# Patient Record
Sex: Female | Born: 1970 | Race: Black or African American | Hispanic: No | Marital: Married | Smoking: Never smoker
Health system: Southern US, Community
[De-identification: ages and names within clinical notes are randomized; demographics above are authoritative.]

## PROBLEM LIST (undated history)

## (undated) DIAGNOSIS — K8020 Calculus of gallbladder without cholecystitis without obstruction: Secondary | ICD-10-CM

## (undated) DIAGNOSIS — H547 Unspecified visual loss: Secondary | ICD-10-CM

## (undated) DIAGNOSIS — R51 Headache: Secondary | ICD-10-CM

## (undated) DIAGNOSIS — J3089 Other allergic rhinitis: Secondary | ICD-10-CM

## (undated) HISTORY — DX: Unspecified visual loss: H54.7

## (undated) HISTORY — DX: Calculus of gallbladder without cholecystitis without obstruction: K80.20

## (undated) HISTORY — DX: Other allergic rhinitis: J30.89

## (undated) HISTORY — DX: Headache: R51

---

## 2016-04-11 HISTORY — PX: CHOLECYSTECTOMY: SHX55

## 2017-05-28 ENCOUNTER — Ambulatory Visit (INDEPENDENT_AMBULATORY_CARE_PROVIDER_SITE_OTHER): Payer: Self-pay | Admitting: Internal Medicine

## 2017-05-28 ENCOUNTER — Encounter: Payer: Self-pay | Admitting: Internal Medicine

## 2017-05-28 VITALS — BP 112/82 | HR 74 | Resp 12 | Ht 62.0 in | Wt 171.0 lb

## 2017-05-28 DIAGNOSIS — R1084 Generalized abdominal pain: Secondary | ICD-10-CM

## 2017-05-28 DIAGNOSIS — G8929 Other chronic pain: Secondary | ICD-10-CM

## 2017-05-28 DIAGNOSIS — R51 Headache: Secondary | ICD-10-CM

## 2017-05-28 DIAGNOSIS — R519 Headache, unspecified: Secondary | ICD-10-CM

## 2017-05-28 DIAGNOSIS — J3089 Other allergic rhinitis: Secondary | ICD-10-CM

## 2017-05-28 DIAGNOSIS — M542 Cervicalgia: Secondary | ICD-10-CM

## 2017-05-28 DIAGNOSIS — H547 Unspecified visual loss: Secondary | ICD-10-CM

## 2017-05-28 HISTORY — DX: Headache, unspecified: R51.9

## 2017-05-28 HISTORY — DX: Other chronic pain: G89.29

## 2017-05-28 HISTORY — DX: Unspecified visual loss: H54.7

## 2017-05-28 HISTORY — DX: Other allergic rhinitis: J30.89

## 2017-05-28 MED ORDER — FAMOTIDINE 20 MG PO TABS: ORAL_TABLET | ORAL | 3 refills | Status: DC

## 2017-05-28 MED ORDER — CYCLOBENZAPRINE HCL 5 MG PO TABS: ORAL_TABLET | ORAL | 1 refills | Status: DC

## 2017-05-28 MED ORDER — CETIRIZINE HCL 10 MG PO TABS: 10.0000 mg | ORAL_TABLET | Freq: Every day | ORAL | 11 refills | Status: DC

## 2017-05-28 NOTE — Progress Notes (Signed)
LCSWA met with Secily to complete the new patient screener. Brianda explained that because she has a lot of pain she has very low energy and has difficulties concentrating on her daily activities. She is also dealing with being unemployed and having two teenaged children who she often worries about. Nneoma reported that being unemployed makes it difficult to afford medications. LCSWA informed B[place]ley about counseling services. No follow up needed.
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
## 2017-05-29 LAB — CBC WITH DIFFERENTIAL/PLATELET
BASOS: 1 %
Basophils Absolute: 0 10*3/uL (ref 0.0–0.2)
EOS (ABSOLUTE): 0.1 10*3/uL (ref 0.0–0.4)
EOS: 2 %
HEMATOCRIT: 36.3 % (ref 34.0–46.6)
Hemoglobin: 11.7 g/dL (ref 11.1–15.9)
IMMATURE GRANS (ABS): 0 10*3/uL (ref 0.0–0.1)
IMMATURE GRANULOCYTES: 0 %
Lymphocytes Absolute: 2.6 10*3/uL (ref 0.7–3.1)
Lymphs: 53 %
MCH: 28.6 pg (ref 26.6–33.0)
MCHC: 32.2 g/dL (ref 31.5–35.7)
MCV: 89 fL (ref 79–97)
MONOS ABS: 0.3 10*3/uL (ref 0.1–0.9)
Monocytes: 6 %
NEUTROS ABS: 1.9 10*3/uL (ref 1.4–7.0)
Neutrophils: 38 %
Platelets: 244 10*3/uL (ref 150–379)
RBC: 4.09 x10E6/uL (ref 3.77–5.28)
RDW: 14.2 % (ref 12.3–15.4)
WBC: 4.9 10*3/uL (ref 3.4–10.8)

## 2017-05-29 LAB — COMPREHENSIVE METABOLIC PANEL
ALT: 12 IU/L (ref 0–32)
AST: 17 IU/L (ref 0–40)
Albumin/Globulin Ratio: 1.3 (ref 1.2–2.2)
Albumin: 3.9 g/dL (ref 3.5–5.5)
Alkaline Phosphatase: 48 IU/L (ref 39–117)
BILIRUBIN TOTAL: 0.4 mg/dL (ref 0.0–1.2)
BUN/Creatinine Ratio: 20 (ref 9–23)
BUN: 12 mg/dL (ref 6–24)
CHLORIDE: 104 mmol/L (ref 96–106)
CO2: 23 mmol/L (ref 20–29)
Calcium: 9 mg/dL (ref 8.7–10.2)
Creatinine, Ser: 0.61 mg/dL (ref 0.57–1.00)
GFR, EST AFRICAN AMERICAN: 124 mL/min/{1.73_m2} (ref >59)
GFR, EST NON AFRICAN AMERICAN: 108 mL/min/{1.73_m2} (ref >59)
GLOBULIN, TOTAL: 3.1 g/dL (ref 1.5–4.5)
Glucose: 83 mg/dL (ref 65–99)
Potassium: 3.9 mmol/L (ref 3.5–5.2)
SODIUM: 142 mmol/L (ref 134–144)
TOTAL PROTEIN: 7 g/dL (ref 6.0–8.5)

## 2017-08-25 ENCOUNTER — Ambulatory Visit: Payer: Self-pay | Admitting: Internal Medicine

## 2017-08-27 ENCOUNTER — Ambulatory Visit: Payer: Self-pay | Admitting: Internal Medicine

## 2017-09-17 ENCOUNTER — Encounter: Payer: Self-pay | Admitting: Internal Medicine

## 2017-09-17 ENCOUNTER — Ambulatory Visit: Payer: Self-pay | Admitting: Internal Medicine

## 2017-09-17 VITALS — BP 122/80 | HR 76 | Resp 12 | Ht 62.0 in | Wt 163.0 lb

## 2017-09-17 DIAGNOSIS — H547 Unspecified visual loss: Secondary | ICD-10-CM

## 2017-09-17 DIAGNOSIS — K2970 Gastritis, unspecified, without bleeding: Secondary | ICD-10-CM

## 2017-09-17 DIAGNOSIS — M7918 Myalgia, other site: Secondary | ICD-10-CM

## 2017-09-17 DIAGNOSIS — R51 Headache: Secondary | ICD-10-CM

## 2017-09-17 DIAGNOSIS — R519 Headache, unspecified: Secondary | ICD-10-CM

## 2017-09-17 DIAGNOSIS — J3089 Other allergic rhinitis: Secondary | ICD-10-CM

## 2017-09-17 DIAGNOSIS — Z23 Encounter for immunization: Secondary | ICD-10-CM

## 2017-09-17 DIAGNOSIS — G8929 Other chronic pain: Secondary | ICD-10-CM

## 2017-09-17 MED ORDER — FEXOFENADINE HCL 180 MG PO TABS: 180.0000 mg | ORAL_TABLET | Freq: Every day | ORAL | Status: DC

## 2017-09-17 MED ORDER — IBUPROFEN 200 MG PO TABS: ORAL_TABLET | ORAL | Status: DC

## 2017-09-17 NOTE — Progress Notes (Signed)
   Subjective:    Patient ID: Alison Stanton, female    DOB: 02/17/1971, 46 y.o.   MRN: [id number]  HPI   Alison Stanton here to interpret  1.  Headaches:  Did see Optometry end of August, but were not sure what to do about getting glasses.   [place] to [place].  Alison Stanton states he received a phone call from the PT clinic, but he understood that they were to call him back and never heard anything more. Discussed to call [facility] at [facility] in future if not hearing about follow up or if have questions. Cyclobenzaprine helped with sleep, but did not help with headache.  Has not taken Cyclobenzaprine for a week.    2.  Abdominal pain: Was using Famotidine only when she had stomach pain.  This has worked well for her.  Only filled it once since first visit in July.  Is still using ibuprofen despite recommendations to stop.    3.  Allergies:  Not taking Cetirizine.  Stopped as made her sleepy, even when she took at bedtime.  Did help at times with allergy symptoms.  Not clear that it helped with headaches.  Is having some allergy symptoms.    4.  Slipped cleaning the shower 5 days ago.  Struck her right nuchal area on the sink and then landed on her right side.   She is hurting along her entire right side.  Pain is improving day by day, but still with discomfort. Ibuprofen helps.    Current Meds  Medication Sig  . ibuprofen (ADVIL,MOTRIN) 200 MG tablet Take 200 mg every 6 (six) hours as needed by mouth.    No Known Allergies    Review of Systems     Objective:   Physical Exam  NAD HEENT: PERRL, EOMI, discs sharp TMs pearly gray, throat without injection.  No palpable swelling over nuchal area bilaterally.  No discoloration. Neck:  Supple, no adenopathy.  Tender still over traps, medial to scapulae, and up cervical paraspinous musulature to nuchal ridge, left worse than right. Chest:  CTA CV:  RRR without murmur or rub, radial pulses normal and equal. Abd:  S, NT, No HSM or mass,  + BS MS:  Moves all extrems fine.  No bruising or swelling.  Mild tenderness over right greater trochanteric area where she has some pain.  No crepitation.      Assessment & Plan:  1.  Decreased Visual Acuity:  [facility].  2.  Headaches/multifactorial:  As above:  S[facility] back [facility].  3.  Allergies:  To pick up Fexofenadine 180 mg daily and take instead of Cetirizine as less sedating for most.  4.  Gastritis/PUD:  To keep taking Famotidine 40 mg daily, not just on days she is having symptoms.  Would like her to take consistently for 2 months at least.  5.  Fall with what appears to be minor soft tissue discomforts:  Tylenol or low dose Ibuprofen for pain as needed.  6.  HM: influenza vaccine today.

## 2017-12-15 ENCOUNTER — Ambulatory Visit: Payer: Self-pay | Admitting: Internal Medicine

## 2017-12-16 ENCOUNTER — Encounter: Payer: Self-pay | Admitting: Internal Medicine

## 2017-12-16 ENCOUNTER — Ambulatory Visit: Payer: Self-pay | Admitting: Internal Medicine

## 2017-12-16 VITALS — BP 122/80 | HR 70 | Resp 12 | Ht 62.0 in | Wt 171.0 lb

## 2017-12-16 DIAGNOSIS — H547 Unspecified visual loss: Secondary | ICD-10-CM

## 2017-12-16 DIAGNOSIS — R51 Headache: Secondary | ICD-10-CM

## 2017-12-16 DIAGNOSIS — G8929 Other chronic pain: Secondary | ICD-10-CM

## 2017-12-16 DIAGNOSIS — M7918 Myalgia, other site: Secondary | ICD-10-CM

## 2017-12-16 DIAGNOSIS — M7061 Trochanteric bursitis, right hip: Secondary | ICD-10-CM

## 2017-12-16 DIAGNOSIS — J3089 Other allergic rhinitis: Secondary | ICD-10-CM

## 2017-12-16 DIAGNOSIS — M25511 Pain in right shoulder: Secondary | ICD-10-CM

## 2017-12-16 DIAGNOSIS — R519 Headache, unspecified: Secondary | ICD-10-CM

## 2017-12-16 MED ORDER — MOMETASONE FUROATE 50 MCG/ACT NA SUSP: NASAL | 12 refills | Status: DC

## 2017-12-16 MED ORDER — FAMOTIDINE 20 MG PO TABS: ORAL_TABLET | ORAL | 3 refills | Status: DC

## 2017-12-16 NOTE — Progress Notes (Signed)
   Subjective:    Patient ID: Alison Stanton, female    DOB: [birth year], 47 y.o.   MRN: [id number]  HPI  1.  Headaches:  Multifactorial:  Still did not make it to [facility].  States she just found out from her English speaking roommate that the PT clinic was trying to contact her for an appointment and had left messages in English on the phone.  This was our second attempt to get her in. Her friend Nas, who accompanies her today is willing to take phone calls for her to get an appointment. Her headaches are better with her glasses.  She is still having muscular tension headache in neck and back of head and would like another attempt to get treatment there again.   Since she fell in the shower with last visit, she continues to have right sided shoulder and lateral thigh pain.  She would like this addressed with PT as well.   2.  Decreased visual acuity:  Did get the glasses and now her headache pain is much less and her vision is much better with the glasses.   3.  Allergies:  She did start the Allegra 180 mg daily.  She states it works well for her except her symptoms return when she stops taking the medication.  Discussed this is a treated health issue, not a cured one and symptoms will return if she stops the medication. No pets Does have carpeting.  Vacuums every 2 weeks. Lots of pillows on bed.   No hypoallergenic mattress and pillow covers.  4.  Gastritis/PUD:  Taking Famotidine 40 mg daily.  States her abdominal discomfort is much better.  She has been taking for about 3 months now.    Current Meds  Medication Sig  . fexofenadine (ALLEGRA) 180 MG tablet Take 1 tablet (180 mg total) daily by mouth.  Marland Kitchen. ibuprofen (ADVIL,MOTRIN) 200 MG tablet 2-4 tabs by mouth every 6 hours as needed for pain    No Known Allergies Review of Systems     Objective:   Physical Exam   NAD HEENT:  Conjunctivae without injection.  Nasal mucosa swollen and boggy, cobbled posterior  pharynx.  TMs pearly gray. Neck:  Supple, No adenopathy.  Tender over traps bilaterally and paracervical musculature to nuchal ridge. Right shoulder:  Tender over subacromial bursa with internal and external rotation. Right thigh:  Tender over right greater trochanter Chest:  CTA CV:  RRR wihout murmur or rub. Radial pulses normal and equal. Abd:  S, NT, No HSM or mass, + BS         Assessment & Plan:  1.  Headaches:  Again, multifactorial.  Improved with new glasses and treatment of allergies.  Musculoskeletal attention for neck issues with another attempt with PT.  2.  Allergies:  Add Nasonex nasal spray.  Hypoallergenic pillow and mattress covers.  Continue Allegra.  3.  Right shoulder and right greater trochanteric bursitis:  PT as well.  4.  Gastritis/PUD:  Has completed course of Famotidine.  Okay to stop unless symptoms recur.    Add Nasonex

## 2017-12-16 NOTE — Patient Instructions (Signed)
Hypoallergenic pillow and mattress covers--wipe down weekly when you wash your bed covers. Vacuum 1-2 times weekly after dusting.

## 2018-01-18 ENCOUNTER — Encounter: Payer: Self-pay | Admitting: Internal Medicine

## 2018-03-16 ENCOUNTER — Encounter: Payer: Self-pay | Admitting: Internal Medicine

## 2018-07-05 ENCOUNTER — Other Ambulatory Visit: Payer: Self-pay

## 2018-07-05 ENCOUNTER — Encounter (HOSPITAL_COMMUNITY): Payer: Self-pay | Admitting: Emergency Medicine

## 2018-07-05 ENCOUNTER — Emergency Department (HOSPITAL_COMMUNITY)
Admission: EM | Admit: 2018-07-05 | Discharge: 2018-07-05 | Disposition: A | Discharge status: Home / Self Care | Payer: Self-pay | Attending: Emergency Medicine | Admitting: Emergency Medicine

## 2018-07-05 DIAGNOSIS — R109 Unspecified abdominal pain: Secondary | ICD-10-CM | POA: Insufficient documentation

## 2018-07-05 LAB — COMPREHENSIVE METABOLIC PANEL
ALBUMIN: 3.5 g/dL (ref 3.5–5.0)
ALT: 14 U/L (ref 0–44)
ANION GAP: 4 — AB (ref 5–15)
AST: 21 U/L (ref 15–41)
Alkaline Phosphatase: 42 U/L (ref 38–126)
BILIRUBIN TOTAL: 0.7 mg/dL (ref 0.3–1.2)
BUN: 12 mg/dL (ref 6–20)
CHLORIDE: 108 mmol/L (ref 98–111)
CO2: 25 mmol/L (ref 22–32)
Calcium: 8.6 mg/dL — ABNORMAL LOW (ref 8.9–10.3)
Creatinine, Ser: 0.8 mg/dL (ref 0.44–1.00)
GFR calc Af Amer: >60 mL/min (ref >60)
GLUCOSE: 98 mg/dL (ref 70–99)
Potassium: 3.7 mmol/L (ref 3.5–5.1)
Sodium: 137 mmol/L (ref 135–145)
Total Protein: 7 g/dL (ref 6.5–8.1)

## 2018-07-05 LAB — CBC
HCT: 39.6 % (ref 36.0–46.0)
HEMOGLOBIN: 12.3 g/dL (ref 12.0–15.0)
MCH: 28.6 pg (ref 26.0–34.0)
MCHC: 31.1 g/dL (ref 30.0–36.0)
MCV: 92.1 fL (ref 78.0–100.0)
Platelets: 241 10*3/uL (ref 150–400)
RBC: 4.3 MIL/uL (ref 3.87–5.11)
RDW: 13 % (ref 11.5–15.5)
WBC: 5.7 10*3/uL (ref 4.0–10.5)

## 2018-07-05 LAB — URINALYSIS, ROUTINE W REFLEX MICROSCOPIC
Bilirubin Urine: NEGATIVE
Glucose, UA: NEGATIVE mg/dL
Hgb urine dipstick: NEGATIVE
Ketones, ur: NEGATIVE mg/dL
LEUKOCYTES UA: NEGATIVE
NITRITE: NEGATIVE
PH: 6 (ref 5.0–8.0)
Protein, ur: NEGATIVE mg/dL
SPECIFIC GRAVITY, URINE: 1.015 (ref 1.005–1.030)

## 2018-07-05 LAB — LIPASE, BLOOD: LIPASE: 42 U/L (ref 11–51)

## 2018-07-05 LAB — I-STAT BETA HCG BLOOD, ED (MC, WL, AP ONLY): I-stat hCG, quantitative: <5 m[IU]/mL (ref <5)

## 2018-07-05 MED ORDER — SUCRALFATE 1 G PO TABS
1.0000 g | ORAL_TABLET | Freq: Once | ORAL | Status: AC
  Administered 2018-07-05: 1 g via ORAL
  Filled 2018-07-05: qty 1

## 2018-07-05 MED ORDER — FAMOTIDINE IN NACL 20-0.9 MG/50ML-% IV SOLN
20.0000 mg | Freq: Once | INTRAVENOUS | Status: AC
  Administered 2018-07-05: 20 mg via INTRAVENOUS
  Filled 2018-07-05: qty 50

## 2018-07-05 MED ORDER — SODIUM CHLORIDE 0.9 % IV BOLUS
500.0000 mL | Freq: Once | INTRAVENOUS | Status: AC
  Administered 2018-07-05: 500 mL via INTRAVENOUS

## 2018-07-05 MED ORDER — GI COCKTAIL ~~LOC~~
30.0000 mL | Freq: Once | ORAL | Status: AC
  Administered 2018-07-05: 30 mL via ORAL
  Filled 2018-07-05: qty 30

## 2018-07-05 MED ORDER — FAMOTIDINE 20 MG PO TABS: 20.0000 mg | ORAL_TABLET | Freq: Two times a day (BID) | ORAL | 0 refills | Status: DC

## 2018-07-05 MED ORDER — SUCRALFATE 1 G PO TABS: 1.0000 g | ORAL_TABLET | Freq: Three times a day (TID) | ORAL | 0 refills | Status: DC

## 2018-07-05 NOTE — ED Notes (Signed)
Pt given d/c instructions via translation through pt's husband. Pt and husband verbalized understanding and verified understanding of prescription drop-off options. Pt had no other concerns prior to ambulating to lobby.

## 2018-07-05 NOTE — ED Provider Notes (Signed)
[facility] EMERGENCY DEPARTMENT Provider Note   CSN: [id number] Arrival date & time: 07/05/18  1731     History   Chief Complaint Chief Complaint  Patient presents with  . Abdominal Pain  . Generalized Body Aches   Patient originally from [place].  She speaks [place].  She was offered translator multiple times refused.  Her family member is at bedside and assist with a history and translation.  HPI Alison Stanton is a 47 y.o. female.  HPI   Patient is a 47 year old female with a history of chronic headaches, decreased visual acuity, seasonal allergies, gastritis/PUD, cholecystectomy, who presents the emergency department today planing of epigastric abdominal pain that has been present for the last several years but seem to have worsened this morning.  Patient has had pain intermittently.  Seems to be worse after spicy foods.  Pain does not radiate.  She rates it a 9/10.  Describes it as a burning feeling.  Is associated with nausea and vomiting.  Had 2 episodes of vomiting yesterday that has since resolved.  She also had one episode of diarrhea this morning.  Has intermittent constipation, but none currently.  States she had one episode of dark red blood per rectum last week and has had none since. Denies any urinary symptoms.  Is complaining of body aches, chills, and subjective fevers.  She has not taken her temperature at home. Took Motrin around noon today.  No chest pain or shortness of breath.  Reviewed prior records.  Patient was seen by her PCP 12/2017.  At that time had been prescribed famotidine 40 mg daily for PUD/gastritis and her symptoms had been improving.  She states that she is no longer taking famotidine, but it seemed to help her symptoms when she was taking it.  States that her symptoms today are consistent with symptoms she was seen for at this PCP visit.  Denies any recent trips out of the country or eating any spoiled foods recently.  Past Medical  History:  Diagnosis Date  . Chronic headaches 05/28/2017  . 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 Environmental and seasonal allergies 05/28/2017    Patient Active Problem List   Diagnosis Date Noted  . 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 Chronic headaches 05/28/2017  . Environmental and seasonal allergies 05/28/2017    Past Surgical History:  Procedure Laterality Date  . CHOLECYSTECTOMY  04/2016   [place]  [place]     OB History   None      Home Medications    Prior to Admission medications   Medication Sig Start Date End Date Taking? Authorizing Provider  famotidine (PEPCID) 20 MG tablet Take 1 tablet (20 mg total) by mouth 2 (two) times daily for 14 days. 07/05/18 07/19/18  Khameron Gruenwald S, PA-C  fexofenadine (ALLEGRA) 180 MG tablet Take 1 tablet (180 mg total) daily by mouth. Patient not taking: Reported on 07/05/2018 09/17/17   Julieanne Manson, MD  mometasone (NASONEX) 50 MCG/ACT nasal spray 2 sprays each nostril daily Patient not taking: Reported on 07/05/2018 12/16/17   Julieanne Manson, MD  sucralfate (CARAFATE) 1 g tablet Take 1 tablet (1 g total) by mouth 3 (three) times daily with meals for 14 days. 07/05/18 07/19/18  Adyn Hoes S, PA-C    Family History Family History  Problem Relation Age of Onset  . Hypertension Mother   . Headache Father   . Hypertension Father     Social History Social History   Tobacco  Use  . Smoking status: Never Smoker  . Smokeless tobacco: Never Used  Substance Use Topics  . Alcohol use: No  . Drug use: No     Allergies   Patient has no known allergies.   Review of Systems Review of Systems  Constitutional: Negative for chills and fever.  HENT: Negative for ear pain and sore throat.   Eyes: Negative for pain and visual disturbance.  Respiratory: Negative for cough and shortness of breath.   Cardiovascular: Negative for chest pain.  Gastrointestinal: Positive for abdominal pain, blood in  stool (resolved), nausea and vomiting. Negative for constipation and diarrhea.  Genitourinary: Negative for dysuria and hematuria.  Musculoskeletal: Positive for myalgias.  Skin: Negative for rash.  Neurological: Negative for dizziness, weakness, light-headedness, numbness and headaches.  All other systems reviewed and are negative.  Physical Exam Updated Vital Signs BP 101/73   Pulse 66   Temp 98.3 F (36.8 C) (Oral)   Resp 20   Ht 5' (1.524 m)   LMP 06/28/2018 (Exact Date)   SpO2 100%   BMI 33.40 kg/m   Physical Exam  Constitutional: She appears well-developed and well-nourished.  Non-toxic appearance. No distress.  HENT:  Head: Normocephalic and atraumatic.  Eyes: Conjunctivae are normal. No scleral icterus.  Neck: Neck supple.  Cardiovascular: Normal rate, regular rhythm, normal heart sounds and intact distal pulses.  No murmur heard. Pulmonary/Chest: Effort normal and breath sounds normal. No stridor. No respiratory distress. She has no wheezes. She has no rales.  Abdominal: Soft. There is no CVA tenderness.  Mild epigastric and RUQ abd TTP. No rebound tenderness. No guarding. No rigidity.   Musculoskeletal: She exhibits no edema.  Neurological: She is alert.  Skin: Skin is warm and dry. Capillary refill takes less than 2 seconds.  Psychiatric: She has a normal mood and affect.  Nursing note and vitals reviewed.  ED Treatments / Results  Labs (all labs ordered are listed, but only abnormal results are displayed) Labs Reviewed  COMPREHENSIVE METABOLIC PANEL - Abnormal; Notable for the following components:      Result Value   Calcium 8.6 (*)    Anion gap 4 (*)    All other components within normal limits  LIPASE, BLOOD  CBC  URINALYSIS, ROUTINE W REFLEX MICROSCOPIC  I-STAT BETA HCG BLOOD, ED ([facility], [facility], [facility] ONLY)    EKG None  Radiology No results found.  Procedures Procedures (including critical care time)  Medications Ordered in ED Medications    sucralfate (CARAFATE) tablet 1 g (1 g Oral Given 07/05/18 1953)  famotidine (PEPCID) IVPB 20 mg premix (0 mg Intravenous Stopped 07/05/18 2124)  sodium chloride 0.9 % bolus 500 mL (0 mLs Intravenous Stopped 07/05/18 2124)  gi cocktail (Maalox,Lidocaine,Donnatal) (30 mLs Oral Given 07/05/18 1954)     Initial Impression / Assessment and Plan / ED Course  I have reviewed the triage vital signs and the nursing notes.  Pertinent labs & imaging results that were available during my care of the patient were reviewed by me and considered in my medical decision making (see chart for details).     Final Clinical Impressions(s) / ED Diagnoses   Final diagnoses:  Abdominal pain, unspecified abdominal location   Patient is nontoxic, nonseptic appearing, in no apparent distress.  Patient's pain and other symptoms adequately managed in emergency department.  Fluid bolus given.  Labs, imaging and vitals reviewed.  Leukocytosis or anemia.  Normal electrolytes.  Normal kidney and liver function.  Lipase negative.  Negative pregnancy test.  UA negative for UTI.  Patient does not meet the SIRS or Sepsis criteria.  On repeat exam patient's temp times feel completely resolved after GI cocktail, Pepcid and Carafate.  No episodes of vomiting in the ED.  Was able to tolerate p.o.  Does not have a surgical abdomin and there are no peritoneal signs.  No indication of appendicitis, bowel obstruction, bowel perforation, cholecystitis, diverticulitis, PID or ectopic pregnancy.  Suspect symptoms are due to her chronic gastritis/PUD.  Patient discharged home with symptomatic treatment and given strict instructions for follow-up with their primary care physician.  I have also discussed reasons to return immediately to the ER.  Patient expresses understanding and agrees with plan.  ED Discharge Orders         Ordered    famotidine (PEPCID) 20 MG tablet  2 times daily     07/05/18 2223    sucralfate (CARAFATE) 1 g tablet  3  times daily with meals     07/05/18 2223           Karrie Meres, PA-C 07/05/18 2225    Lorre Nick, MD 07/07/18 [phone]

## 2018-07-05 NOTE — Discharge Instructions (Addendum)

## 2018-07-05 NOTE — ED Triage Notes (Signed)
Pt to ED with c/o generalized abd pain and body aches x's 3 days.  No nausea or vomiting

## 2018-09-22 ENCOUNTER — Ambulatory Visit: Payer: Self-pay | Attending: Family Medicine | Admitting: Family Medicine

## 2018-09-22 ENCOUNTER — Encounter: Payer: Self-pay | Admitting: Family Medicine

## 2018-09-22 VITALS — BP 118/79 | HR 76 | Temp 98.2°F | Resp 18 | Ht 64.0 in | Wt 182.0 lb

## 2018-09-22 DIAGNOSIS — K219 Gastro-esophageal reflux disease without esophagitis: Secondary | ICD-10-CM

## 2018-09-22 DIAGNOSIS — G8929 Other chronic pain: Secondary | ICD-10-CM

## 2018-09-22 DIAGNOSIS — G43109 Migraine with aura, not intractable, without status migrainosus: Secondary | ICD-10-CM

## 2018-09-22 DIAGNOSIS — Z8249 Family history of ischemic heart disease and other diseases of the circulatory system: Secondary | ICD-10-CM | POA: Insufficient documentation

## 2018-09-22 DIAGNOSIS — R002 Palpitations: Secondary | ICD-10-CM

## 2018-09-22 DIAGNOSIS — M25511 Pain in right shoulder: Secondary | ICD-10-CM

## 2018-09-22 MED ORDER — RIZATRIPTAN BENZOATE 10 MG PO TABS: 10.0000 mg | ORAL_TABLET | ORAL | 1 refills | Status: DC | PRN

## 2018-09-22 MED ORDER — OMEPRAZOLE 40 MG PO CPDR: 40.0000 mg | DELAYED_RELEASE_CAPSULE | Freq: Every day | ORAL | 3 refills | Status: DC

## 2018-09-22 MED ORDER — TOPIRAMATE 25 MG PO TABS: 25.0000 mg | ORAL_TABLET | Freq: Every day | ORAL | 1 refills | Status: DC

## 2018-09-22 MED ORDER — DICLOFENAC SODIUM 1 % TD GEL: TRANSDERMAL | 6 refills | Status: DC

## 2018-09-22 NOTE — Progress Notes (Signed)
Subjective:    Patient ID: Alison Stanton, female    DOB: 04-05-1971, 47 y.o.   MRN: [id number]  HPI       47 year old female who is originally from [place] who presents with complaint of chronic pain in the right shoulder and down her right arm for about 5 months as well as recurrent headaches for over the past year.  Patient states that she has had pain in her right shoulder for about 5 months.  Patient does not recall any initial injury to her shoulder.  Shoulder pain is worse if she lies on her right side or tries to lift objects with her right hand or if she tries to lift her right hand above her head.  Patient is right-handed.  Pain occurs off and on and is about a 7 or 8 on a 0-10 scale and is a dull, aching sensation but sharp with certain movements.        Patient reports that the headaches are in the top of her head and start as a crawling sensation and then become throbbing.  Headaches are an 8-9 on a 0-to-10 scale.  Headaches are almost daily.  Patient has tried ibuprofen and Tylenol in the past to help with her headaches without relief.  Patient has now started to have stomach upset/acid reflux symptoms with the use of ibuprofen and therefore she is no longer taking this medication.  Patient also has blurred vision which can occur with or without the headache.  Patient sometimes sees wavy lines in her vision prior to onset of headache.  Patient does have nausea as well as sensitivity to light and noise during a headache.  Patient at times has palpitations and shortness of breath which occur with her headaches.  Patient also feels as if she is recently started having cold symptoms over the last 5 days with some nasal congestion, postnasal drainage and nonproductive cough.  Patient denies fever or chills.      Patient denies any past allergies.  Patient reports past medical history is significant only for her recurrent headaches.  Patient reports family history of mother and father having  hypertension.  Patient reports that she is divorced.  Patient does not drink or smoke cigarettes.   Review of Systems  Constitutional: Positive for fatigue. Negative for chills and fever.  HENT: Positive for congestion, postnasal drip and rhinorrhea. Negative for ear pain, sinus pressure, sinus pain, sneezing, sore throat and trouble swallowing.   Eyes: Positive for photophobia and visual disturbance.       Symptoms occur with headaches  Respiratory: Positive for cough and shortness of breath (with headache).   Cardiovascular: Positive for palpitations (occurs with her headaches). Negative for chest pain and leg swelling.  Gastrointestinal: Positive for nausea (sometimes occurs with headaches). Negative for abdominal pain.  Endocrine: Negative for polydipsia, polyphagia and polyuria.  Genitourinary: Negative for dysuria and frequency.  Musculoskeletal: Positive for arthralgias and myalgias. Negative for back pain, gait problem and joint swelling.  Neurological: Positive for headaches. Negative for facial asymmetry and light-headedness.       Objective:   Physical Exam BP 118/79 (BP Location: Left Arm, Patient Position: Sitting, Cuff Size: Normal)   Pulse 76   Temp 98.2 F (36.8 C) (Oral)   Resp 18   Ht 5\' 4"  (1.626 m)   Wt 182 lb (82.6 kg)   LMP 08/25/2018   SpO2 100%   BMI 31.24 kg/m Nurse's notes and vital signs reviewed General-well-nourished, well-developed  female in no acute distress. HEENT-head is a traumatic normocephalic, conjunctiva normal, extraocular movements intact, pupils equally round and reactive to light, TMs dull, nares with moderate edema of the nasal turbinates with mild edema/erythema and clear nasal discharge, patient with posterior pharynx erythema with mild cobblestoning Neck-supple, no lymphadenopathy, no thyromegaly, no carotid bruit Lungs-clear to auscultation bilaterally Cardiovascular-regular rate and rhythm Abdomen-soft, nontender Back-no CVA  tenderness Musculoskeletal- patient with some tenderness to palpation of the lateral anterior and posterior right shoulder.  Positive empty can sign/impingement sign at the right shoulder.  No arm weakness.  Patient with discomfort with overhead arm movement on the right. Extremities-no edema Neuro-cranial nerves II through XII are grossly intact        Assessment & Plan:  1. Migraine with aura and without status migrainosus, not intractable Will have patient start Topamax 25mg  once daily at bedtime as a preventative and if she tolerates this ok then will increase dose and times per day to help lessen the number and severity of her headaches. Patient will be be given RX for Maxalt to take as needed for her headache pain - topiramate (TOPAMAX) 25 MG tablet; Take 1 tablet (25 mg total) by mouth at bedtime. To help prevent headaches  Dispense: 30 tablet; Refill: 1 - rizatriptan (MAXALT) 10 MG tablet; Take 1 tablet (10 mg total) by mouth as needed for migraine. May repeat in 2 hours if needed; max 4 pills/24 hours  Dispense: 10 tablet; Refill: 1  2. Gastroesophageal reflux disease, esophagitis presence not specified Will place patient on Omeprazole 40 mg once per day and have her avoid known trigger foods as well as spicy, greasy foods and avoidance of late night eating - omeprazole (PRILOSEC) 40 MG capsule; Take 1 capsule (40 mg total) by mouth daily. To reduce stomach acid  Dispense: 30 capsule; Refill: 3  3. Palpitations Complaint of palpitations at times during headaches- will check TSH, BMP and CBC to look for thyroid disorder, electrolyte abnormality or anemia in follow-up and if labs are normal and symptoms persist then may need EKG and holtor monitoring to determine the cause of her palpitations - TSH - Basic Metabolic Panel - CBC with Differential  4. Chronic right shoulder pain Patient with chronic right shoulder pain, possibly with rotator cuff tendonopathy and Rx provided for her to  try voltaren gel for pain so that she will not have increased GERD symptoms from additional NSAID's at this time - diclofenac sodium (VOLTAREN) 1 % GEL; Apply up to 4 g, 4 times daily as needed for shoulder pain  Dispense: 2 Tube; Refill: 6  An After Visit Summary was printed and given to the patient.  Return in about 6 weeks (around 11/03/2018).

## 2018-09-23 LAB — CBC WITH DIFFERENTIAL/PLATELET
Basophils Absolute: 0.1 x10E3/uL (ref 0.0–0.2)
Basos: 1 %
EOS (ABSOLUTE): 0.1 x10E3/uL (ref 0.0–0.4)
Eos: 2 %
Hematocrit: 35.5 % (ref 34.0–46.6)
Hemoglobin: 11.7 g/dL (ref 11.1–15.9)
Immature Grans (Abs): 0 x10E3/uL (ref 0.0–0.1)
Immature Granulocytes: 0 %
Lymphocytes Absolute: 2.1 x10E3/uL (ref 0.7–3.1)
Lymphs: 47 %
MCH: 28.7 pg (ref 26.6–33.0)
MCHC: 33 g/dL (ref 31.5–35.7)
MCV: 87 fL (ref 79–97)
Monocytes Absolute: 0.3 x10E3/uL (ref 0.1–0.9)
Monocytes: 7 %
Neutrophils Absolute: 1.9 x10E3/uL (ref 1.4–7.0)
Neutrophils: 43 %
Platelets: 221 x10E3/uL (ref 150–450)
RBC: 4.07 x10E6/uL (ref 3.77–5.28)
RDW: 12.1 % — ABNORMAL LOW (ref 12.3–15.4)
WBC: 4.4 x10E3/uL (ref 3.4–10.8)

## 2018-09-23 LAB — BASIC METABOLIC PANEL WITH GFR
BUN/Creatinine Ratio: 15 (ref 9–23)
BUN: 11 mg/dL (ref 6–24)
CO2: 25 mmol/L (ref 20–29)
Calcium: 9 mg/dL (ref 8.7–10.2)
Chloride: 105 mmol/L (ref 96–106)
Creatinine, Ser: 0.73 mg/dL (ref 0.57–1.00)
GFR calc Af Amer: 112 mL/min/1.73
GFR calc non Af Amer: 97 mL/min/1.73
Glucose: 72 mg/dL (ref 65–99)
Potassium: 4 mmol/L (ref 3.5–5.2)
Sodium: 142 mmol/L (ref 134–144)

## 2018-09-23 LAB — TSH: TSH: 1.17 u[IU]/mL (ref 0.450–4.500)

## 2018-10-07 ENCOUNTER — Telehealth (INDEPENDENT_AMBULATORY_CARE_PROVIDER_SITE_OTHER): Payer: Self-pay

## 2018-10-07 ENCOUNTER — Encounter (INDEPENDENT_AMBULATORY_CARE_PROVIDER_SITE_OTHER): Payer: Self-pay

## 2018-10-07 NOTE — Telephone Encounter (Signed)
-----   Message from Cain Saupeammie Fulp, MD sent at 09/24/2018  4:08 PM EST ----- Notify patient of normal CBC, normal BMP and normal TSH

## 2018-10-07 NOTE — Telephone Encounter (Signed)
Call placed using [employer] [phone](/[phone]) left voicemail asking patient to call office. Results mailed as they are normal. Maryjean Mornempestt S Roberts, CMA

## 2018-11-17 ENCOUNTER — Ambulatory Visit: Payer: Self-pay | Admitting: Family Medicine

## 2019-02-17 ENCOUNTER — Other Ambulatory Visit: Payer: Self-pay

## 2019-02-17 ENCOUNTER — Ambulatory Visit: Payer: Self-pay | Admitting: Family Medicine

## 2019-02-18 ENCOUNTER — Ambulatory Visit: Payer: Self-pay | Admitting: Family Medicine

## 2019-02-23 ENCOUNTER — Emergency Department (HOSPITAL_COMMUNITY)
Admission: EM | Admit: 2019-02-23 | Discharge: 2019-02-23 | Disposition: A | Discharge status: Home / Self Care | Payer: Self-pay | Attending: Emergency Medicine | Admitting: Emergency Medicine

## 2019-02-23 ENCOUNTER — Emergency Department (HOSPITAL_COMMUNITY): Payer: Self-pay

## 2019-02-23 ENCOUNTER — Encounter (HOSPITAL_COMMUNITY): Payer: Self-pay | Admitting: Emergency Medicine

## 2019-02-23 ENCOUNTER — Encounter: Payer: Self-pay | Admitting: Primary Care

## 2019-02-23 ENCOUNTER — Other Ambulatory Visit: Payer: Self-pay

## 2019-02-23 DIAGNOSIS — J301 Allergic rhinitis due to pollen: Secondary | ICD-10-CM | POA: Insufficient documentation

## 2019-02-23 DIAGNOSIS — Z79899 Other long term (current) drug therapy: Secondary | ICD-10-CM | POA: Insufficient documentation

## 2019-02-23 MED ORDER — MONTELUKAST SODIUM 10 MG PO TABS: 10.0000 mg | ORAL_TABLET | Freq: Every day | ORAL | 0 refills | Status: DC

## 2019-02-23 MED ORDER — FLUTICASONE PROPIONATE 50 MCG/ACT NA SUSP: 1.0000 | Freq: Every day | NASAL | 2 refills | Status: DC

## 2019-02-23 MED ORDER — IBUPROFEN 400 MG PO TABS
600.0000 mg | ORAL_TABLET | Freq: Once | ORAL | Status: AC
  Administered 2019-02-23: 600 mg via ORAL
  Filled 2019-02-23: qty 1

## 2019-02-23 NOTE — ED Notes (Signed)
ED Provider at bedside. 

## 2019-02-23 NOTE — ED Triage Notes (Signed)
Pt here with right sided abdominal pain, headache, sob, cough, and itchy eyes. Pt states has been experiencing cold symptoms intermittently x3 months. Pt denies fever, chills, sick contacts, and travel.

## 2019-02-23 NOTE — ED Notes (Signed)
Patient verbalizes understanding of discharge instructions. Opportunity for questioning and answers were provided. Armband removed by staff, pt discharged from ED.  

## 2019-02-23 NOTE — Discharge Instructions (Addendum)
Singulair and Flonase daily. Continue with Allegra and Zyrtec, take 1 in the morning, take 1 at night.

## 2019-02-23 NOTE — ED Provider Notes (Signed)
[facility]   CSN: [id number] Arrival date & time: 02/23/19  1049    History   Chief Complaint No chief complaint on file.   HPI Alison Stanton is a 48 y.o. female.     48yo female with history of chronic headaches and allergies presents with multiple complaints. Translator used for visit [place](French). Patient reports headache and bodyaches that are chronic in nature, ongoing for several years, nothing new or different today, taking Tylenol without relief. States for the past 2 weeks she has sneezing, itchy mouth, itchy eyes and ears. Denies fevers, chills, sick contacts. States she has had a cough for the past 3 months, non productive, denies SHOB, wheezing, night sweats, unintentional weight loss.   Alison Stanton was evaluated in Emergency Department on 02/23/2019 for the symptoms described in the history of present illness. She was evaluated in the context of the global COVID-19 pandemic, which necessitated consideration that the patient might be at risk for infection with the SARS-CoV-2 virus that causes COVID-19. Institutional protocols and algorithms that pertain to the evaluation of patients at risk for COVID-19 are in a state of rapid change based on information released by regulatory bodies including the CDC and federal and state organizations. These policies and algorithms were followed during the patient's care in the ED.      Past Medical History:  Diagnosis Date  . Chronic headaches 05/28/2017  . 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 Environmental and seasonal allergies 05/28/2017    Patient Active Problem List   Diagnosis Date Noted  . 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 Chronic headaches 05/28/2017  . Environmental and seasonal allergies 05/28/2017    Past Surgical History:  Procedure Laterality Date  . CHOLECYSTECTOMY  04/2016   [place]aparoscopic--[facility]  [place]     OB History   No obstetric history  on file.      Home Medications    Prior to Admission medications   Medication Sig Start Date End Date Taking? Authorizing Provider  diclofenac sodium (VOLTAREN) 1 % GEL Apply up to 4 g, 4 times daily as needed for shoulder pain 09/22/18   Fulp, Cammie, MD  famotidine (PEPCID) 20 MG tablet Take 1 tablet (20 mg total) by mouth 2 (two) times daily for 14 days. 07/05/18 09/22/18  Couture, Cortni S, PA-C  fluticasone (FLONASE) 50 MCG/ACT nasal spray Place 1 spray into both nostrils daily. 02/23/19   Jeannie FendMurphy, Lilu Mcglown A, PA-C  montelukast (SINGULAIR) 10 MG tablet Take 1 tablet (10 mg total) by mouth at bedtime. 02/23/19   Jeannie FendMurphy, Briar Witherspoon A, PA-C  omeprazole (PRILOSEC) 40 MG capsule Take 1 capsule (40 mg total) by mouth daily. To reduce stomach acid 09/22/18   Fulp, Cammie, MD  rizatriptan (MAXALT) 10 MG tablet Take 1 tablet (10 mg total) by mouth as needed for migraine. May repeat in 2 hours if needed; max 4 pills/24 hours 09/22/18   Fulp, Cammie, MD  topiramate (TOPAMAX) 25 MG tablet Take 1 tablet (25 mg total) by mouth at bedtime. To help prevent headaches 09/22/18   Cain SaupeFulp, Cammie, MD    Family History Family History  Problem Relation Age of Onset  . Hypertension Mother   . Headache Father   . Hypertension Father     Social History Social History   Tobacco Use  . Smoking status: Never Smoker  . Smokeless tobacco: Never Used  Substance Use Topics  . Alcohol use: No  . Drug use: No  Allergies   Patient has no known allergies.   Review of Systems Review of Systems  Constitutional: Negative for fever.  HENT: Positive for rhinorrhea and sneezing. Negative for congestion, ear discharge, ear pain, sinus pressure, sinus pain and sore throat.   Eyes: Positive for itching. Negative for discharge and redness.  Respiratory: Positive for cough. Negative for shortness of breath and wheezing.   Gastrointestinal: Negative for abdominal pain.  Musculoskeletal: Positive for myalgias.  Skin:  Negative for rash and wound.  Allergic/Immunologic: Negative for immunocompromised state.  Neurological: Positive for headaches.  All other systems reviewed and are negative.    Physical Exam Updated Vital Signs BP 102/60   Pulse 80   Temp 99.1 F (37.3 C) (Oral)   Resp (!) 22   SpO2 98%   Physical Exam Vitals signs and nursing note reviewed.  Constitutional:      General: She is not in acute distress.    Appearance: She is well-developed. She is not diaphoretic.  HENT:     Head: Normocephalic and atraumatic.     Right Ear: Tympanic membrane and ear canal normal.     Left Ear: Tympanic membrane and ear canal normal.     Nose: Congestion present.     Mouth/Throat:     Mouth: Mucous membranes are moist.     Pharynx: No oropharyngeal exudate or posterior oropharyngeal erythema.  Eyes:     Conjunctiva/sclera: Conjunctivae normal.  Cardiovascular:     Rate and Rhythm: Normal rate and regular rhythm.     Pulses: Normal pulses.     Heart sounds: Normal heart sounds.  Pulmonary:     Effort: Pulmonary effort is normal.     Breath sounds: Normal breath sounds.  Skin:    General: Skin is warm and dry.  Neurological:     Mental Status: She is alert and oriented to person, place, and time.     Cranial Nerves: No cranial nerve deficit.     Sensory: No sensory deficit.     Gait: Gait normal.  Psychiatric:        Behavior: Behavior normal.      ED Treatments / Results  Labs (all labs ordered are listed, but only abnormal results are displayed) Labs Reviewed - No data to display  EKG None  Radiology Dg Chest 2 View  Result Date: 02/23/2019 CLINICAL DATA:  Shortness of breath EXAM: CHEST - 2 VIEW COMPARISON:  None. FINDINGS: Lungs are clear. Heart size and pulmonary vascularity are normal. No adenopathy. No bone lesions. IMPRESSION: No edema or consolidation. Electronically Signed   By: Bretta Bang III M.D.   On: 02/23/2019 11:42    Procedures Procedures  (including critical care time)  Medications Ordered in ED Medications  ibuprofen (ADVIL,MOTRIN) tablet 600 mg (600 mg Oral Given 02/23/19 1151)     Initial Impression / Assessment and Plan / ED Course  I have reviewed the triage vital signs and the nursing notes.  Pertinent labs & imaging results that were available during my care of the patient were reviewed by me and considered in my medical decision making (see chart for details).  Clinical Course as of Feb 23 1219  Tue Feb 23, 2019  1218 47yo female with multiple complaints, acute complaint today with sneezing with itchy watery eyes/nose/ears/throat. On exam, clear nasal drainage with pale/boggy nasal membranes, exam otherwise unremarkable. CXR obtained due to cough x 3 months, normal. Patient is taking zyrtec currently, possibly allegra. Added singulair and flonase. Recommend recheck  with pcp.    [LM]    Clinical Course User Index [LM] Jeannie Fend, PA-C      Final Clinical Impressions(s) / ED Diagnoses   Final diagnoses:  Seasonal allergic rhinitis due to pollen    ED Discharge Orders         Ordered    fluticasone (FLONASE) 50 MCG/ACT nasal spray  Daily     02/23/19 1204    montelukast (SINGULAIR) 10 MG tablet  Daily at bedtime     02/23/19 1204           Alden Hipp 02/23/19 1220    Gerhard Munch, MD 02/23/19 1644

## 2019-02-23 NOTE — Progress Notes (Signed)
Medical Assistant used [employer] to contact patient.  Interpreter Name: Constance Goltz #: [id number] MA unable to reach patient or LVM on home phone due to it being full. Patient is aware of needing to return a phone call regarding their appointment on the mobile number. Patient was not available, [employer] left patient a voicemail. Voicemail states to give a call back to [place] with [facility] at [phone]. Patient picked up on the mobile line and refused to answer the interpreting after him sharing it was "[place] calling for her doctors appointment from [facility]". MA returned the call two more times and patient sent the call to voicemail. MA informed patient via VM that the appointment will be cancelled and she may return a call for a reschedule.

## 2019-02-23 NOTE — ED Notes (Signed)
Patient transported to X-ray 

## 2019-02-26 ENCOUNTER — Encounter (HOSPITAL_COMMUNITY): Payer: Self-pay | Admitting: *Deleted

## 2019-02-26 ENCOUNTER — Emergency Department (HOSPITAL_COMMUNITY)
Admission: EM | Admit: 2019-02-26 | Discharge: 2019-02-27 | Disposition: A | Discharge status: Home / Self Care | Payer: Self-pay | Attending: Emergency Medicine | Admitting: Emergency Medicine

## 2019-02-26 ENCOUNTER — Other Ambulatory Visit: Payer: Self-pay

## 2019-02-26 DIAGNOSIS — R519 Headache, unspecified: Secondary | ICD-10-CM

## 2019-02-26 DIAGNOSIS — M791 Myalgia, unspecified site: Secondary | ICD-10-CM | POA: Insufficient documentation

## 2019-02-26 DIAGNOSIS — R51 Headache: Secondary | ICD-10-CM | POA: Insufficient documentation

## 2019-02-26 DIAGNOSIS — M546 Pain in thoracic spine: Secondary | ICD-10-CM | POA: Insufficient documentation

## 2019-02-26 DIAGNOSIS — M542 Cervicalgia: Secondary | ICD-10-CM | POA: Insufficient documentation

## 2019-02-26 DIAGNOSIS — Z79899 Other long term (current) drug therapy: Secondary | ICD-10-CM | POA: Insufficient documentation

## 2019-02-26 NOTE — ED Triage Notes (Signed)
Headaches, body aches, back pain, runny nose, scratchy throat for two weeks. No fevers, no cough, no travel. Ibuprofen around 1300. Pt says she was seen before for the same, the prescriptions prescribed were not available when she went to pick them up. She is [place] speaking and interpreter services were used for triage.

## 2019-02-27 MED ORDER — PROCHLORPERAZINE EDISYLATE 10 MG/2ML IJ SOLN
10.0000 mg | Freq: Once | INTRAMUSCULAR | Status: AC
  Administered 2019-02-27: 10 mg via INTRAVENOUS
  Filled 2019-02-27: qty 2

## 2019-02-27 MED ORDER — KETOROLAC TROMETHAMINE 15 MG/ML IJ SOLN
15.0000 mg | Freq: Once | INTRAMUSCULAR | Status: AC
  Administered 2019-02-27: 15 mg via INTRAVENOUS
  Filled 2019-02-27: qty 1

## 2019-02-27 MED ORDER — DEXAMETHASONE SODIUM PHOSPHATE 10 MG/ML IJ SOLN
10.0000 mg | Freq: Once | INTRAMUSCULAR | Status: AC
  Administered 2019-02-27: 02:00:00 10 mg via INTRAVENOUS
  Filled 2019-02-27: qty 1

## 2019-02-27 MED ORDER — DIPHENHYDRAMINE HCL 50 MG/ML IJ SOLN
25.0000 mg | Freq: Once | INTRAMUSCULAR | Status: AC
  Administered 2019-02-27: 02:00:00 25 mg via INTRAVENOUS
  Filled 2019-02-27: qty 1

## 2019-02-27 NOTE — ED Provider Notes (Signed)
[facility] EMERGENCY DEPARTMENT Provider Note  CSN: [id number] Arrival date & time: 02/26/19 1846  Chief Complaint(s) Headache  HPI Alison Stanton is a 48 y.o. female with a history of chronic migraine headaches and reported history of rheumatism presents to the emergency department with 1 week of gradually worsening right-sided headache that extends down the neck upper back and rest of hemithorax.  Pain is exacerbated with movement and palpation of these regions.  Patient has tried over-the-counter Tylenol and Motrin without relief.  Denies any nausea or vomiting.  No recent fevers or infections.  Patient does endorse runny nose and scratchy throat and was seen recently and diagnosed with allergic rhinitis.  No abdominal pain or diarrhea.  No urinary symptoms.  Pain is typical for her chronic migraines.  The history is provided by the patient. The history is limited by a language barrier. A language interpreter was used.    Past Medical History Past Medical History:  Diagnosis Date  . Chronic headaches 05/28/2017  . 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 Environmental and seasonal allergies 05/28/2017   Patient Active Problem List   Diagnosis Date Noted  . 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 Chronic headaches 05/28/2017  . Environmental and seasonal allergies 05/28/2017   Home Medication(s) Prior to Admission medications   Medication Sig Start Date End Date Taking? Authorizing Provider  diclofenac sodium (VOLTAREN) 1 % GEL Apply up to 4 g, 4 times daily as needed for shoulder pain 09/22/18   Fulp, Cammie, MD  famotidine (PEPCID) 20 MG tablet Take 1 tablet (20 mg total) by mouth 2 (two) times daily for 14 days. 07/05/18 09/22/18  Couture, Cortni S, PA-C  fluticasone (FLONASE) 50 MCG/ACT nasal spray Place 1 spray into both nostrils daily. 02/23/19   Jeannie Fend, PA-C  montelukast (SINGULAIR) 10 MG tablet Take 1 tablet (10 mg total) by mouth at bedtime. 02/23/19    Jeannie Fend, PA-C  omeprazole (PRILOSEC) 40 MG capsule Take 1 capsule (40 mg total) by mouth daily. To reduce stomach acid 09/22/18   Fulp, Cammie, MD  rizatriptan (MAXALT) 10 MG tablet Take 1 tablet (10 mg total) by mouth as needed for migraine. May repeat in 2 hours if needed; max 4 pills/24 hours 09/22/18   Fulp, Cammie, MD  topiramate (TOPAMAX) 25 MG tablet Take 1 tablet (25 mg total) by mouth at bedtime. To help prevent headaches 09/22/18   Cain Saupe, MD                                                                                                                                    Past Surgical History Past Surgical History:  Procedure Laterality Date  . CHOLECYSTECTOMY  04/2016   [place]  [place]   Family History Family History  Problem Relation Age of Onset  . Hypertension Mother   . Headache Father   . Hypertension Father  Social History Social History   Tobacco Use  . Smoking status: Never Smoker  . Smokeless tobacco: Never Used  Substance Use Topics  . Alcohol use: No  . Drug use: No   Allergies Patient has no known allergies.  Review of Systems Review of Systems All other systems are reviewed and are negative for acute change except as noted in the HPI  Physical Exam Vital Signs  I have reviewed the triage vital signs BP 113/68 (BP Location: Right Arm)   Pulse 85   Temp 98.5 F (36.9 C) (Oral)   Resp 16   LMP 01/29/2019   SpO2 99%   Physical Exam Vitals signs reviewed.  Constitutional:      General: She is not in acute distress.    Appearance: She is well-developed. She is not diaphoretic.  HENT:     Head: Normocephalic and atraumatic.     Nose: Nose normal.  Eyes:     General: No scleral icterus.       Right eye: No discharge.        Left eye: No discharge.     Conjunctiva/sclera: Conjunctivae normal.     Pupils: Pupils are equal, round, and reactive to light.  Neck:     Musculoskeletal: Normal range of motion  and neck supple. Normal range of motion. Muscular tenderness present. No neck rigidity.     Meningeal: Brudzinski's sign and Kernig's sign absent.   Cardiovascular:     Rate and Rhythm: Normal rate and regular rhythm.     Heart sounds: No murmur. No friction rub. No gallop.   Pulmonary:     Effort: Pulmonary effort is normal. No respiratory distress.     Breath sounds: Normal breath sounds. No stridor. No rales.  Abdominal:     General: There is no distension.     Palpations: Abdomen is soft.     Tenderness: There is no abdominal tenderness.  Musculoskeletal:     Cervical back: She exhibits tenderness. She exhibits no bony tenderness.       Back:  Skin:    General: Skin is warm and dry.     Findings: No erythema or rash.  Neurological:     Mental Status: She is alert and oriented to person, place, and time.     Comments: Mental Status:  Alert and oriented to person, place, and time.  Attention and concentration normal.  Speech clear.  Recent memory is intact  Cranial Nerves:  II Visual Fields: Intact to confrontation. Visual fields intact. III, IV, VI: Pupils equal and reactive to light and near. Full eye movement without nystagmus  V Facial Sensation: Normal. No weakness of masticatory muscles  VII: No facial weakness or asymmetry  VIII Auditory Acuity: Grossly normal  IX/X: The uvula is midline; the palate elevates symmetrically  XI: Normal sternocleidomastoid and trapezius strength  XII: The tongue is midline. No atrophy or fasciculations.   Motor System: Muscle Strength: 5/5 and symmetric in the upper and lower extremities. No pronation or drift.  Muscle Tone: Tone and muscle bulk are normal in the upper and lower extremities.   Reflexes: DTRs: 1+ and symmetrical in all four extremities. No Clonus Coordination: Intact finger-to-nose, heel-to-shin. No tremor.  Sensation: Intact to light touch, and pinprick. Negative Romberg test.  Gait: Routine  gait normal.      ED  Results and Treatments Labs (all labs ordered are listed, but only abnormal results are displayed) Labs Reviewed - No data to display  EKG  EKG Interpretation  Date/Time:    Ventricular Rate:    PR Interval:    QRS Duration:   QT Interval:    QTC Calculation:   R Axis:     Text Interpretation:        Radiology No results found. Pertinent labs & imaging results that were available during my care of the patient were reviewed by me and considered in my medical decision making (see chart for details).  Medications Ordered in ED Medications  diphenhydrAMINE (BENADRYL) injection 25 mg (25 mg Intravenous Given 02/27/19 0140)  dexamethasone (DECADRON) injection 10 mg (10 mg Intravenous Given 02/27/19 0140)  prochlorperazine (COMPAZINE) injection 10 mg (10 mg Intravenous Given 02/27/19 0139)  ketorolac (TORADOL) 15 MG/ML injection 15 mg (15 mg Intravenous Given 02/27/19 0140)                                                                                                                                    Procedures Procedures  (including critical care time)  Medical Decision Making / ED Course I have reviewed the nursing notes for this encounter and the patient's prior records (if available in EHR or on provided paperwork).    Typical migraine headache for the pt. Non focal neuro exam. No recent head trauma. No fever. Doubt meningitis. Doubt intracranial bleed. Doubt IIH. No indication for imaging. Will treat with migraine cocktail and reevaluate.  Near complete resolution of patient's headache and other symptoms following migraine cocktail.  The patient appears reasonably screened and/or stabilized for discharge and I doubt any other medical condition or other [facility] requiring further screening, evaluation, or treatment in the ED at this time prior to discharge.  The  patient is safe for discharge with strict return precautions.   Final Clinical Impression(s) / ED Diagnoses Final diagnoses:  Bad headache  Muscle pain   Disposition: Discharge  Condition: Good  I have discussed the results, Dx and Tx plan with the patient who expressed understanding and agree(s) with the plan. Discharge instructions discussed at great length. The patient was given strict return precautions who verbalized understanding of the instructions. No further questions at time of discharge.    ED Discharge Orders    None       Follow Up: Cain SaupeFulp, Cammie, MD [address] [place] [address] [phone]  Schedule an appointment as soon as possible for a visit  As needed      This chart was dictated using voice recognition software.  Despite best efforts to proofread,  errors can occur which can change the documentation meaning.   Nira Connardama, Pedro Eduardo, MD 02/27/19 [phone]

## 2019-03-08 ENCOUNTER — Encounter (HOSPITAL_COMMUNITY): Payer: Self-pay

## 2019-03-08 ENCOUNTER — Other Ambulatory Visit: Payer: Self-pay

## 2019-03-08 ENCOUNTER — Ambulatory Visit (HOSPITAL_COMMUNITY)
Admission: EM | Admit: 2019-03-08 | Discharge: 2019-03-08 | Disposition: A | Discharge status: Home / Self Care | Payer: Self-pay | Attending: Family Medicine | Admitting: Family Medicine

## 2019-03-08 ENCOUNTER — Telehealth: Payer: Self-pay | Admitting: Family Medicine

## 2019-03-08 DIAGNOSIS — R51 Headache: Secondary | ICD-10-CM

## 2019-03-08 DIAGNOSIS — R519 Headache, unspecified: Secondary | ICD-10-CM

## 2019-03-08 MED ORDER — KETOROLAC TROMETHAMINE 60 MG/2ML IM SOLN
INTRAMUSCULAR | Status: AC
  Filled 2019-03-08: qty 2

## 2019-03-08 MED ORDER — METOCLOPRAMIDE HCL 5 MG/ML IJ SOLN
INTRAMUSCULAR | Status: AC
  Filled 2019-03-08: qty 2

## 2019-03-08 MED ORDER — METOCLOPRAMIDE HCL 5 MG/ML IJ SOLN
10.0000 mg | Freq: Once | INTRAMUSCULAR | Status: AC
  Administered 2019-03-08: 10 mg via INTRAMUSCULAR

## 2019-03-08 MED ORDER — KETOROLAC TROMETHAMINE 60 MG/2ML IM SOLN
60.0000 mg | Freq: Once | INTRAMUSCULAR | Status: AC
  Administered 2019-03-08: 19:00:00 60 mg via INTRAMUSCULAR

## 2019-03-08 MED ORDER — NAPROXEN 500 MG PO TABS: 500.0000 mg | ORAL_TABLET | Freq: Two times a day (BID) | ORAL | 0 refills | Status: DC

## 2019-03-08 MED ORDER — CYCLOBENZAPRINE HCL 5 MG PO TABS: 5.0000 mg | ORAL_TABLET | Freq: Two times a day (BID) | ORAL | 0 refills | Status: DC | PRN

## 2019-03-08 MED ORDER — OLOPATADINE HCL 0.1 % OP SOLN: 1.0000 [drp] | Freq: Two times a day (BID) | OPHTHALMIC | 12 refills | Status: DC

## 2019-03-08 NOTE — Telephone Encounter (Signed)
Called [employer] interpreter and spoke with Jodvi ID number [phone] who called patient. Jodvi informed patient with information what provider stated. Per pt she will go to urgent care. Staff gived patient the address for [facility] and patient wrote address down and stated she will go today.

## 2019-03-08 NOTE — Telephone Encounter (Signed)
Please encourage patient to go to urgent care today for further evaluation and also have her placed on someone's schedule tomorrow just in case she does not go to urgent care tonight

## 2019-03-08 NOTE — ED Notes (Signed)
Patient verbalizes understanding of discharge instructions. Opportunity for questioning and answers were provided. Patient discharged from [facility] by provider.  

## 2019-03-08 NOTE — Telephone Encounter (Signed)
Patient would prefer To get a call around 11 if possible. Please follow up

## 2019-03-08 NOTE — Discharge Instructions (Addendum)
We gave you toradol and reglan for your headache Please follow-up in the emergency room if having persistent or worsening headache  May use Naprosyn twice daily at home with food for further headache management May use olopatadine eyedrops twice daily in both eyes to help with watering and itching  You may use flexeril as needed to help with your neck and back pain. This is a muscle relaxer and causes sedation- please use only at bedtime or when you will be home and not have to drive/work-begin with 1 tablet, may increase to 2 if needed

## 2019-03-08 NOTE — ED Provider Notes (Signed)
[facility]    CSN: [id number] Arrival date & time: 03/08/19  1734     History   Chief Complaint Chief Complaint  Patient presents with  . Back Pain  . Fever  . Headache    HPI Alison Stanton Alison Stanton Alison Stanton is a 48 y.o. female history of chronic headaches, presenting today for evaluation of headache and back pain.  Patient states that for the past 3 weeks she has had headaches.  Headaches will be on one side and then the other, but frequently also on the crown of her head.  Has associated photophobia and phonophobia.  She also notes that she has discomfort throughout her neck and back.  This is also chronic in nature.  Denies any specific injury.  She has been taking ibuprofen as well as Tylenol PM occasionally.  She was previously seen in the emergency room 10 days ago for similar symptoms and similar location.  She received Toradol, Decadron, Benadryl, symptoms improved temporarily and returned after approximately 1 day.  Through chart review it also appears that she has chronic migraine headaches as well as neck and back pain.  She does feel her symptoms have worsened.  She has had some nausea associated with her headaches.  Occasionally vomiting with eating.  Denies abdominal pain or diarrhea.  Also reports frequent eye watering and irritation.  Symptoms are bilateral.  Occasional blurring with watering.  HPI  Past Medical History:  Diagnosis Date  . Chronic headaches 05/28/2017  . 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 Environmental and seasonal allergies 05/28/2017    Patient Active Problem List   Diagnosis Date Noted  . 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 Chronic headaches 05/28/2017  . Environmental and seasonal allergies 05/28/2017    Past Surgical History:  Procedure Laterality Date  . CHOLECYSTECTOMY  04/2016   [place]--[facility]  [place]    OB History   No obstetric history on file.      Home Medications     Prior to Admission medications   Medication Sig Start Date End Date Taking? Authorizing Provider  cyclobenzaprine (FLEXERIL) 5 MG tablet Take 1-2 tablets (5-10 mg total) by mouth 2 (two) times daily as needed for muscle spasms. 03/08/19   Yuleidy Rappleye C, PA-C  diclofenac sodium (VOLTAREN) 1 % GEL Apply up to 4 g, 4 times daily as needed for shoulder pain 09/22/18   Fulp, Cammie, MD  famotidine (PEPCID) 20 MG tablet Take 1 tablet (20 mg total) by mouth 2 (two) times daily for 14 days. 07/05/18 09/22/18  Couture, Cortni S, PA-C  fluticasone (FLONASE) 50 MCG/ACT nasal spray Place 1 spray into both nostrils daily. 02/23/19   Jeannie Fend, PA-C  montelukast (SINGULAIR) 10 MG tablet Take 1 tablet (10 mg total) by mouth at bedtime. 02/23/19   Jeannie Fend, PA-C  naproxen (NAPROSYN) 500 MG tablet Take 1 tablet (500 mg total) by mouth 2 (two) times daily. 03/08/19   Raymond Bhardwaj C, PA-C  olopatadine (PATANOL) 0.1 % ophthalmic solution Place 1 drop into both eyes 2 (two) times daily. 03/08/19   Wendelin Reader C, PA-C  omeprazole (PRILOSEC) 40 MG capsule Take 1 capsule (40 mg total) by mouth daily. To reduce stomach acid 09/22/18   Fulp, Cammie, MD  rizatriptan (MAXALT) 10 MG tablet Take 1 tablet (10 mg total) by mouth as needed for migraine. May repeat in 2 hours if needed; max 4 pills/24 hours 09/22/18   Cain Saupe, MD  topiramate (  TOPAMAX) 25 MG tablet Take 1 tablet (25 mg total) by mouth at bedtime. To help prevent headaches 09/22/18   Cain Saupe, MD    Family History Family History  Problem Relation Age of Onset  . Hypertension Mother   . Headache Father   . Hypertension Father     Social History Social History   Tobacco Use  . Smoking status: Never Smoker  . Smokeless tobacco: Never Used  Substance Use Topics  . Alcohol use: No  . Drug use: No     Allergies   Patient has no known allergies.   Review of Systems Review of Systems  Constitutional: Negative for fatigue  and fever.  HENT: Negative for congestion, sinus pressure and sore throat.   Eyes: Negative for photophobia, pain and visual disturbance.  Respiratory: Negative for cough and shortness of breath.   Cardiovascular: Negative for chest pain.  Gastrointestinal: Positive for nausea. Negative for abdominal pain and vomiting.  Genitourinary: Negative for decreased urine volume and hematuria.  Musculoskeletal: Positive for back pain, myalgias and neck pain. Negative for neck stiffness.  Neurological: Positive for headaches. Negative for dizziness, syncope, facial asymmetry, speech difficulty, weakness, light-headedness and numbness.     Physical Exam Triage Vital Signs ED Triage Vitals  Enc Vitals Group     BP 03/08/19 1804 116/78     Pulse Rate 03/08/19 1804 94     Resp 03/08/19 1804 18     Temp 03/08/19 1804 97.9 F (36.6 C)     Temp Source 03/08/19 1804 Oral     SpO2 03/08/19 1804 100 %     Weight 03/08/19 1805 220 lb (99.8 kg)     Height --      Head Circumference --      Peak Flow --      Pain Score 03/08/19 1805 8     Pain Loc --      Pain Edu? --      Excl. in GC? --    No data found.  Updated Vital Signs BP 116/78 (BP Location: Right Arm)   Pulse 94   Temp 97.9 F (36.6 C) (Oral)   Resp 18   Wt 220 lb (99.8 kg)   LMP 03/01/2019   SpO2 100%   BMI 37.76 kg/m   Visual Acuity Right Eye Distance:   Left Eye Distance:   Bilateral Distance:    Right Eye Near:   Left Eye Near:    Bilateral Near:     Physical Exam Vitals signs and nursing note reviewed.  Constitutional:      Appearance: She is well-developed.     Comments: Appears uncomfortable  HENT:     Head: Normocephalic and atraumatic.     Ears:     Comments: Bilateral ears without tenderness to palpation of external auricle, tragus and mastoid, EAC's without erythema or swelling, TM's with good bony landmarks and cone of light. Non erythematous.    Mouth/Throat:     Comments: Oral mucosa pink and moist,  no tonsillar enlargement or exudate. Posterior pharynx patent and nonerythematous, no uvula deviation or swelling. Normal phonation. Palate elevates symmetrically Eyes:     Extraocular Movements: Extraocular movements intact.     Conjunctiva/sclera: Conjunctivae normal.     Pupils: Pupils are equal, round, and reactive to light.     Comments: Has discomfort with extraocular motions, no nystagmus  Neck:     Musculoskeletal: Neck supple.  Cardiovascular:     Rate and Rhythm: Normal rate  and regular rhythm.     Heart sounds: No murmur.  Pulmonary:     Effort: Pulmonary effort is normal. No respiratory distress.     Breath sounds: Normal breath sounds.     Comments: Breathing comfortably at rest, CTABL, no wheezing, rales or other adventitious sounds auscultated Abdominal:     Palpations: Abdomen is soft.     Tenderness: There is no abdominal tenderness.  Musculoskeletal:     Comments: Tender diffusely throughout cervical, thoracic spine midline, no palpable step-off or deformity.  No focal tenderness.  Increased tenderness throughout right cervical, thoracic and lumbar musculature  Frequently changing positions to get more comfortable  Skin:    General: Skin is warm and dry.  Neurological:     General: No focal deficit present.     Mental Status: She is alert and oriented to person, place, and time. Mental status is at baseline.     GCS: GCS eye subscore is 4. GCS verbal subscore is 5. GCS motor subscore is 6.     Comments: Patient A&O x3, cranial nerves II-XII grossly intact, strength at shoulders, hips and knees 5/5, equal bilaterally, patellar reflex 2+ bilaterally. Gait without abnormality.      UC Treatments / Results  Labs (all labs ordered are listed, but only abnormal results are displayed) Labs Reviewed - No data to display  EKG None  Radiology No results found.  Procedures Procedures (including critical care time)  Medications Ordered in UC Medications   ketorolac (TORADOL) injection 60 mg (60 mg Intramuscular Given 03/08/19 1900)  metoCLOPramide (REGLAN) injection 10 mg (10 mg Intramuscular Given 03/08/19 1900)    Initial Impression / Assessment and Plan / UC Course  I have reviewed the triage vital signs and the nursing notes.  Pertinent labs & imaging results that were available during my care of the patient were reviewed by me and considered in my medical decision making (see chart for details).     Patient with headache which seems similar in nature to previous headaches, ongoing x3 weeks.  Similar in description to previous notes although patient does report worsening symptoms.  Discussed with patient further evaluation in emergency room given worsening symptoms versus trial of medicines in clinic.  Patient opted for trial of medicines in clinic and outpatient treatment.  Stressed importance of following up in emergency room if having any worsening or persistent symptoms despite treatment today.  Provided Toradol and Reglan today, will send home with Naprosyn and Flexeril to use for headache/back and neck pain.  Olopatadine to help with eye watering.Discussed strict return precautions. Patient verbalized understanding and is agreeable with plan.  Final Clinical Impressions(s) / UC Diagnoses   Final diagnoses:  Acute nonintractable headache, unspecified headache type     Discharge Instructions     We gave you toradol and reglan for your headache Please follow-up in the emergency room if having persistent or worsening headache  May use Naprosyn twice daily at home with food for further headache management May use olopatadine eyedrops twice daily in both eyes to help with watering and itching  You may use flexeril as needed to help with your neck and back pain. This is a muscle relaxer and causes sedation- please use only at bedtime or when you will be home and not have to drive/work-begin with 1 tablet, may increase to 2 if needed     ED Prescriptions    Medication Sig Dispense Auth. Provider   naproxen (NAPROSYN) 500 MG tablet Take 1  tablet (500 mg total) by mouth 2 (two) times daily. 30 tablet Brylon Brenning C, PA-C   olopatadine (PATANOL) 0.1 % ophthalmic solution Place 1 drop into both eyes 2 (two) times daily. 5 mL Julianny Milstein C, PA-C   cyclobenzaprine (FLEXERIL) 5 MG tablet Take 1-2 tablets (5-10 mg total) by mouth 2 (two) times daily as needed for muscle spasms. 24 tablet Ame Heagle, [place] C, PA-C     Controlled Substance Prescriptions [place] Controlled Substance Registry consulted? Not Applicable   Lew Dawes, [place] 03/08/19 1934

## 2019-03-08 NOTE — Telephone Encounter (Signed)
Patient has states she has been experiencing bad headache and only wants to lay down. Patient states they cannot work due to not feeling well. Please follow up.

## 2019-03-08 NOTE — ED Triage Notes (Addendum)
Pt states she has a headache. Pt states she has been taking tylenol 3 weeks. Pt states she has had a fever off and on.

## 2019-05-20 ENCOUNTER — Encounter (HOSPITAL_COMMUNITY): Payer: Self-pay | Admitting: *Deleted

## 2019-05-20 ENCOUNTER — Other Ambulatory Visit: Payer: Self-pay

## 2019-05-20 ENCOUNTER — Emergency Department (HOSPITAL_COMMUNITY)
Admission: EM | Admit: 2019-05-20 | Discharge: 2019-05-20 | Disposition: A | Discharge status: Home / Self Care | Payer: Self-pay | Attending: Emergency Medicine | Admitting: Emergency Medicine

## 2019-05-20 DIAGNOSIS — N644 Mastodynia: Secondary | ICD-10-CM | POA: Insufficient documentation

## 2019-05-20 DIAGNOSIS — Z79899 Other long term (current) drug therapy: Secondary | ICD-10-CM | POA: Insufficient documentation

## 2019-05-20 LAB — POC URINE PREG, ED: Preg Test, Ur: NEGATIVE

## 2019-05-20 MED ORDER — NAPROXEN 250 MG PO TABS
500.0000 mg | ORAL_TABLET | Freq: Once | ORAL | Status: AC
  Administered 2019-05-20: 500 mg via ORAL
  Filled 2019-05-20: qty 2

## 2019-05-20 MED ORDER — NAPROXEN 500 MG PO TABS: 500.0000 mg | ORAL_TABLET | Freq: Two times a day (BID) | ORAL | 0 refills | Status: DC

## 2019-05-20 MED ORDER — ACETAMINOPHEN 500 MG PO TABS
1000.0000 mg | ORAL_TABLET | Freq: Once | ORAL | Status: AC
  Administered 2019-05-20: 1000 mg via ORAL
  Filled 2019-05-20: qty 2

## 2019-05-20 NOTE — Discharge Instructions (Addendum)
You were seen in the ER for pain in your breast.  Exam did not reveal any infection, abscess.  The cause of your pain is still unclear but it may be related to the size of your breast, menstruation or hormonal changes.  Take naproxen 500 mg every 12 hours up to twice a day and alternate 500 to 1000 mg of acetaminophen (Tylenol) every 6-8 hours for pain control.  Make sure you are wearing a well fitted bra.  [facility] to make an appointment with the obstetric and gynecology team.  Return to the ER for fever greater than 100, redness warmth or swelling to your breast, nipple discharge or bleeding   Vous avez t vu aux urgences pour [employer]. L'examen n'a rvl aucune infection, abcs. La cause de [employer] n'est pas encore claire, mais elle peut tre lie  la taille de votre sein, aux menstruations ou aux changements hormonaux. Prenez du naproxne 500 mg toutes les 12 heures jusqu' deux fois par jour et alternez 500  1000 mg d'actaminophne (Tylenol) toutes les 6  8 heures pour [employer]. Assurez-vous de porter un soutien-gorge bien ajust. Appelez l'hpital des femmes pour prendre [employer] l'quipe d'obsttrique et de gyncologie. Revenez aux urgences pour [employer] fivre suprieure  100, une rougeur, Arboricult[employer]ist ou un gonflement des seins, des [employer] ou des saignements

## 2019-05-20 NOTE — ED Provider Notes (Signed)
[facility] Provider Note   CSN: [id number] Arrival date & time: 05/20/19  1211    History   Chief Complaint Chief Complaint  Patient presents with  . Breast Pain    HPI Alison Stanton is a 48 y.o. female presents to the ER for evaluation of bilateral breast pain right greater than left.  Onset last night.  Described as sharp, intermittent, stinging, severe, fleeting lasting only a few minutes.  Associated with swelling bilaterally but right slightly worse than left.  She feels like the veins have engorged around her breast and nipples.  No interventions for this.  No modifying factors.  No alleviating factors.  States 3 years ago a similar type of pain occurred while she was in [place].  She went to a doctor in [place] and had a mammogram was told that everything was okay.  She was given medicines for the pain and the pain eventually went away.  Every now and then she has breast soreness with her menstrual cycle but never this severe.  No recent trauma to the chest.  No associated fevers, redness, warmth, nipple drainage such as pus or bleeding, skin changes.  She is sexually active without condom use.  She is having regular menstrual cycles LMP 6/15. No h/o breast cancer.   HPI  Past Medical History:  Diagnosis Date  . Chronic headaches 05/28/2017  . 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 Environmental and seasonal allergies 05/28/2017    Patient Active Problem List   Diagnosis Date Noted  . 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 Chronic headaches 05/28/2017  . Environmental and seasonal allergies 05/28/2017    Past Surgical History:  Procedure Laterality Date  . CHOLECYSTECTOMY  04/2016   [place]  [place]     OB History   No obstetric history on file.      Home Medications    Prior to Admission medications   Medication Sig Start Date End Date Taking? Authorizing Provider  cyclobenzaprine (FLEXERIL) 5 MG  tablet Take 1-2 tablets (5-10 mg total) by mouth 2 (two) times daily as needed for muscle spasms. 03/08/19   Wieters, Hallie C, PA-C  diclofenac sodium (VOLTAREN) 1 % GEL Apply up to 4 g, 4 times daily as needed for shoulder pain 09/22/18   Fulp, Cammie, MD  famotidine (PEPCID) 20 MG tablet Take 1 tablet (20 mg total) by mouth 2 (two) times daily for 14 days. 07/05/18 09/22/18  Couture, Cortni S, PA-C  fluticasone (FLONASE) 50 MCG/ACT nasal spray Place 1 spray into both nostrils daily. 02/23/19   Jeannie FendMurphy, Laura A, PA-C  montelukast (SINGULAIR) 10 MG tablet Take 1 tablet (10 mg total) by mouth at bedtime. 02/23/19   Jeannie FendMurphy, Laura A, PA-C  naproxen (NAPROSYN) 500 MG tablet Take 1 tablet (500 mg total) by mouth 2 (two) times daily. 05/20/19   Liberty HandyGibbons, Amunique Neyra J, PA-C  olopatadine (PATANOL) 0.1 % ophthalmic solution Place 1 drop into both eyes 2 (two) times daily. 03/08/19   Wieters, Hallie C, PA-C  omeprazole (PRILOSEC) 40 MG capsule Take 1 capsule (40 mg total) by mouth daily. To reduce stomach acid 09/22/18   Fulp, Cammie, MD  rizatriptan (MAXALT) 10 MG tablet Take 1 tablet (10 mg total) by mouth as needed for migraine. May repeat in 2 hours if needed; max 4 pills/24 hours 09/22/18   Fulp, Cammie, MD  topiramate (TOPAMAX) 25 MG tablet Take 1 tablet (25 mg total) by mouth at bedtime. To help  prevent headaches 09/22/18   Cain SaupeFulp, Cammie, MD    Family History Family History  Problem Relation Age of Onset  . Hypertension Mother   . Headache Father   . Hypertension Father     Social History Social History   Tobacco Use  . Smoking status: Never Smoker  . Smokeless tobacco: Never Used  Substance Use Topics  . Alcohol use: No  . Drug use: No     Allergies   Patient has no known allergies.   Review of Systems Review of Systems  Genitourinary:       Breast tenderness swelling   All other systems reviewed and are negative.    Physical Exam Updated Vital Signs BP 119/76   Pulse 80   Temp 98  F (36.7 C)   Resp 16   LMP 04/26/2019 (Exact Date)   SpO2 99%   Physical Exam Vitals signs and nursing note reviewed.  Constitutional:      General: She is not in acute distress.    Appearance: She is well-developed.     Comments: NAD.  Exam and history obtained with interpreter at bedside.  HENT:     Head: Normocephalic and atraumatic.     Right Ear: External ear normal.     Left Ear: External ear normal.     Nose: Nose normal.  Eyes:     General: No scleral icterus.    Conjunctiva/sclera: Conjunctivae normal.  Neck:     Musculoskeletal: Normal range of motion and neck supple.  Cardiovascular:     Rate and Rhythm: Normal rate and regular rhythm.     Heart sounds: Normal heart sounds.  Pulmonary:     Effort: Pulmonary effort is normal.     Breath sounds: Normal breath sounds.  Chest:     Comments: Breasts are very large, symmetrically.  Breast tissue is firm, nodule or specifically around the area Ola bilaterally.  She is diffuse tenderness to bilateral breast more so medially along the sternum and around the areole us.  Skin to the breast is normal without erythema, edema, warmth, peeling.  Area Ola is mildly tender without any drainage, inversion. Musculoskeletal: Normal range of motion.        General: No deformity.  Lymphadenopathy:     Comments: No axillary or supraclavicular or neck lymphadenopathy  Skin:    General: Skin is warm and dry.     Capillary Refill: Capillary refill takes less than 2 seconds.  Neurological:     Mental Status: She is alert and oriented to person, place, and time.  Psychiatric:        Behavior: Behavior normal.        Thought Content: Thought content normal.        Judgment: Judgment normal.      ED Treatments / Results  Labs (all labs ordered are listed, but only abnormal results are displayed) Labs Reviewed  POC URINE PREG, ED    EKG None  Radiology No results found.  Procedures Procedures (including critical care time)   Medications Ordered in ED Medications  naproxen (NAPROSYN) tablet 500 mg (500 mg Oral Given 05/20/19 1504)  acetaminophen (TYLENOL) tablet 1,000 mg (1,000 mg Oral Given 05/20/19 1504)     Initial Impression / Assessment and Plan / ED Course  I have reviewed the triage vital signs and the nursing notes.  Pertinent labs & imaging results that were available during my care of the patient were reviewed by me and considered in my medical  decision making (see chart for details).   DDX includes menstrual/hormonal related breast tenderness and engorgement.  Urine pregnancy is negative today.  No signs of infection, abscess, reactive lymphadenopathy on exam.  She has no constitutional symptoms such as fever, unexpected weight loss, chills.  Recommended OB/GYN follow-up for reevaluation.  DC with naproxen and Tylenol, recommended she get a better fitted bra to help with pain.  Return precautions discussed.  Patient is comfortable with this plan.  Final Clinical Impressions(s) / ED Diagnoses   Final diagnoses:  Pain of both breasts    ED Discharge Orders         Ordered    naproxen (NAPROSYN) 500 MG tablet  2 times daily     05/20/19 1549           Arlean Hopping 05/20/19 1623    Lacretia Leigh, MD 05/26/19 [phone]

## 2019-05-20 NOTE — ED Triage Notes (Signed)
Interpretor - [phone] used for [place]. Pt is to ED for eval of bilateral breast swelling with pain radiating to right arm. This has been for past 3 yrs but worse over the past 6 months. Denies vomiting. Has seen a doctor in [place] for same 2 yrs ago and dx with nothing. No new symptoms- pain just worse in right arm over the past 6 months

## 2019-05-24 ENCOUNTER — Ambulatory Visit: Payer: Self-pay | Admitting: Family Medicine

## 2019-05-28 ENCOUNTER — Ambulatory Visit: Payer: Self-pay | Admitting: Family Medicine

## 2019-06-04 ENCOUNTER — Other Ambulatory Visit: Payer: Self-pay

## 2019-06-04 ENCOUNTER — Ambulatory Visit: Payer: Self-pay | Attending: Family Medicine

## 2019-06-09 ENCOUNTER — Other Ambulatory Visit: Payer: Self-pay

## 2019-06-09 ENCOUNTER — Ambulatory Visit (HOSPITAL_COMMUNITY)
Admission: EM | Admit: 2019-06-09 | Discharge: 2019-06-09 | Disposition: A | Discharge status: Home / Self Care | Payer: Self-pay | Attending: Emergency Medicine | Admitting: Emergency Medicine

## 2019-06-09 ENCOUNTER — Encounter (HOSPITAL_COMMUNITY): Payer: Self-pay

## 2019-06-09 DIAGNOSIS — N644 Mastodynia: Secondary | ICD-10-CM

## 2019-06-09 MED ORDER — ONDANSETRON 4 MG PO TBDP: 4.0000 mg | ORAL_TABLET | Freq: Three times a day (TID) | ORAL | 0 refills | Status: DC | PRN

## 2019-06-09 MED ORDER — IBUPROFEN 800 MG PO TABS: 800.0000 mg | ORAL_TABLET | Freq: Three times a day (TID) | ORAL | 0 refills | Status: DC

## 2019-06-09 NOTE — Discharge Instructions (Signed)
[facility] [phone] They will contact with appointment time  Use anti-inflammatories for pain/swelling. You may take up to 800 mg Ibuprofen every 8 hours with food. You may supplement Ibuprofen with Tylenol [phone] mg every 8 hours.  Warm compresses  Zofran for nausea  Follow up if not resolving or worsening

## 2019-06-09 NOTE — ED Triage Notes (Signed)
Pt states she has breast swelling ( both ) x 1 month and a headache as well.

## 2019-06-10 ENCOUNTER — Telehealth: Payer: Self-pay | Admitting: *Deleted

## 2019-06-10 ENCOUNTER — Other Ambulatory Visit: Payer: Self-pay | Admitting: Emergency Medicine

## 2019-06-10 DIAGNOSIS — N644 Mastodynia: Secondary | ICD-10-CM

## 2019-06-10 NOTE — ED Provider Notes (Signed)
[facility]    CSN: [id number] Arrival date & time: 06/09/19  1208      History   Chief Complaint Chief Complaint  Patient presents with  . Breast swelling    HPI Alison Stanton interpretation via [employer] interpreter Alison Stanton is a 48 y.o. female history of previous cholecystectomy, chronic headaches, presenting today for evaluation of bilateral breast tenderness and swelling.  Patient states that for the past month she has had significant tenderness throughout her breasts bilaterally.  Denies any changes to the skin, but has felt as if they are more swollen.  She continues to have a headache which she is previously been evaluated for.  She denies any nipple discharge or drainage.  She is been seen in the emergency room for similar without relief of Naprosyn prescribed.  Last mammogram was 2 to 3 years ago.  States menstrual cycles are slightly irregular.  Denies worsening of symptoms around menstrual cycle.  She is also had some associated nausea and abdominal pain.  Denies shortness of breath or cough.  Daughter notes that she has significant issues with reflux.  Feels similar.  HPI  Past Medical History:  Diagnosis Date  . Chronic headaches 05/28/2017  . 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 Environmental and seasonal allergies 05/28/2017    Patient Active Problem List   Diagnosis Date Noted  . 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 Chronic headaches 05/28/2017  . Environmental and seasonal allergies 05/28/2017    Past Surgical History:  Procedure Laterality Date  . CHOLECYSTECTOMY  04/2016   [place]  [place]    OB History   No obstetric history on file.      Home Medications    Prior to Admission medications   Medication Sig Start Date End Date Taking? Authorizing Provider  cyclobenzaprine (FLEXERIL) 5 MG tablet Take 1-2 tablets (5-10 mg total) by mouth 2 (two) times daily as needed for muscle spasms. 03/08/19   Arnaldo Heffron C,  PA-C  diclofenac sodium (VOLTAREN) 1 % GEL Apply up to 4 g, 4 times daily as needed for shoulder pain 09/22/18   Fulp, Cammie, MD  famotidine (PEPCID) 20 MG tablet Take 1 tablet (20 mg total) by mouth 2 (two) times daily for 14 days. 07/05/18 09/22/18  Couture, Cortni S, PA-C  fluticasone (FLONASE) 50 MCG/ACT nasal spray Place 1 spray into both nostrils daily. 02/23/19   Jeannie FendMurphy, Laura A, PA-C  ibuprofen (ADVIL) 800 MG tablet Take 1 tablet (800 mg total) by mouth 3 (three) times daily. 06/09/19   Laniqua Torrens C, PA-C  montelukast (SINGULAIR) 10 MG tablet Take 1 tablet (10 mg total) by mouth at bedtime. 02/23/19   Jeannie FendMurphy, Laura A, PA-C  naproxen (NAPROSYN) 500 MG tablet Take 1 tablet (500 mg total) by mouth 2 (two) times daily. 05/20/19   Claude MangesSoto, Johana, PA-C  olopatadine (PATANOL) 0.1 % ophthalmic solution Place 1 drop into both eyes 2 (two) times daily. 03/08/19   Lindzey Zent C, PA-C  omeprazole (PRILOSEC) 40 MG capsule Take 1 capsule (40 mg total) by mouth daily. To reduce stomach acid 09/22/18   Fulp, Cammie, MD  ondansetron (ZOFRAN ODT) 4 MG disintegrating tablet Take 1 tablet (4 mg total) by mouth every 8 (eight) hours as needed for nausea or vomiting. 06/09/19   Chuong Casebeer C, PA-C  rizatriptan (MAXALT) 10 MG tablet Take 1 tablet (10 mg total) by mouth as needed for migraine. May repeat in 2 hours if needed; max 4 pills/24 hours 09/22/18  Fulp, Cammie, MD  topiramate (TOPAMAX) 25 MG tablet Take 1 tablet (25 mg total) by mouth at bedtime. To help prevent headaches 09/22/18   Antony Blackbird, MD    Family History Family History  Problem Relation Age of Onset  . Hypertension Mother   . Headache Father   . Hypertension Father     Social History Social History   Tobacco Use  . Smoking status: Never Smoker  . Smokeless tobacco: Never Used  Substance Use Topics  . Alcohol use: No  . Drug use: No     Allergies   Patient has no known allergies.   Review of Systems Review of Systems   Constitutional: Negative for fatigue and fever.  HENT: Negative for congestion, sinus pressure and sore throat.   Eyes: Negative for visual disturbance.  Respiratory: Negative for cough and shortness of breath.   Cardiovascular: Positive for chest pain.  Gastrointestinal: Positive for abdominal pain and nausea. Negative for vomiting.  Musculoskeletal: Negative for arthralgias and joint swelling.  Skin: Positive for color change. Negative for rash and wound.  Neurological: Negative for dizziness, weakness, light-headedness and headaches.     Physical Exam Triage Vital Signs ED Triage Vitals  Enc Vitals Group     BP 06/09/19 1241 100/68     Pulse Rate 06/09/19 1241 71     Resp 06/09/19 1241 18     Temp 06/09/19 1241 98.6 F (37 C)     Temp Source 06/09/19 1241 Oral     SpO2 06/09/19 1241 100 %     Weight 06/09/19 1240 177 lb (80.3 kg)     Height --      Head Circumference --      Peak Flow --      Pain Score 06/09/19 1239 9     Pain Loc --      Pain Edu? --      Excl. in [place]? --    No data found.  Updated Vital Signs BP 100/68 (BP Location: Right Arm)   Pulse 71   Temp 98.6 F (37 C) (Oral)   Resp 18   Wt 177 lb (80.3 kg)   LMP 05/26/2019   SpO2 100%   BMI 30.38 kg/m   Visual Acuity Right Eye Distance:   Left Eye Distance:   Bilateral Distance:    Right Eye Near:   Left Eye Near:    Bilateral Near:     Physical Exam Vitals signs and nursing note reviewed.  Constitutional:      General: She is not in acute distress.    Appearance: She is well-developed.  HENT:     Head: Normocephalic and atraumatic.  Eyes:     Conjunctiva/sclera: Conjunctivae normal.  Neck:     Musculoskeletal: Neck supple.  Cardiovascular:     Rate and Rhythm: Normal rate and regular rhythm.     Heart sounds: No murmur.  Pulmonary:     Effort: Pulmonary effort is normal. No respiratory distress.     Breath sounds: Normal breath sounds.     Comments: Bilateral breasts large and  pendulous, no obvious discoloration, no obvious masses palpated throughout bilateral breasts, diffuse tenderness throughout breast No nipple discharge seen  Breathing comfortably at rest, CTABL, no wheezing, rales or other adventitious sounds auscultated  Abdominal:     Palpations: Abdomen is soft.     Tenderness: There is abdominal tenderness.     Comments: Well-healed laparoscopy scars in right upper quadrant, tenderness to palpation in epigastrium  and bilateral upper quadrants, no focal tenderness, negative rebound, negative Murphy's  Skin:    General: Skin is warm and dry.  Neurological:     General: No focal deficit present.     Mental Status: She is alert and oriented to person, place, and time. Mental status is at baseline.     Gait: Gait normal.      UC Treatments / Results  Labs (all labs ordered are listed, but only abnormal results are displayed) Labs Reviewed - No data to display  EKG   Radiology No results found.  Procedures Procedures (including critical care time)  Medications Ordered in UC Medications - No data to display  Initial Impression / Assessment and Plan / UC Course  I have reviewed the triage vital signs and the nursing notes.  Pertinent labs & imaging results that were available during my care of the patient were reviewed by me and considered in my medical decision making (see chart for details).     Patient with bilateral breast tenderness, no signs of cellulitis or abscess at this time.  Symptoms persistent x1 month.  Recommending following up with breast center to have updated mammogram.  Will provide ibuprofen as alternative to Naprosyn, warm compresses.  Zofran for nausea, discussed reflux medicine for over-the-counter.  Feel abdominal discomfort likely separate from breast tenderness.  Follow-up if abdominal pain worsening or not resolving with reflux medicines.Discussed strict return precautions. Patient verbalized understanding and is  agreeable with plan.  Final Clinical Impressions(s) / UC Diagnoses   Final diagnoses:  Breast tenderness in female     Discharge Instructions     [facility] [phone]- [phone] They will contact with appointment time  Use anti-inflammatories for pain/swelling. You may take up to 800 mg Ibuprofen every 8 hours with food. You may supplement Ibuprofen with Tylenol [phone] mg every 8 hours.  Warm compresses  Zofran for nausea  Follow up if not resolving or worsening   ED Prescriptions    Medication Sig Dispense Auth. Provider   ibuprofen (ADVIL) 800 MG tablet Take 1 tablet (800 mg total) by mouth 3 (three) times daily. 21 tablet Jonnathan Birman C, PA-C   ondansetron (ZOFRAN ODT) 4 MG disintegrating tablet Take 1 tablet (4 mg total) by mouth every 8 (eight) hours as needed for nausea or vomiting. 20 tablet Abhiram Criado, [place] C, PA-C     Controlled Substance Prescriptions [place] Controlled Substance Registry consulted? Not Applicable   Lew DawesWieters, Daira Hine C, [place]PA-C 06/10/19 [phone]

## 2019-06-10 NOTE — Telephone Encounter (Signed)
Patient daughter called stating patient informed her that someone from this office called and told her to return their call. Per pt daughter, patient said it was from financial. Staff informed patient daughter that per patient chart, there was nothing that stated our office called but message will be forwarded to our financial person. Per pt daughter, when they figure out who called, they will call office back.

## 2020-01-18 ENCOUNTER — Emergency Department (HOSPITAL_COMMUNITY)
Admission: EM | Admit: 2020-01-18 | Discharge: 2020-01-18 | Disposition: A | Discharge status: Home / Self Care | Payer: Self-pay | Attending: Emergency Medicine | Admitting: Emergency Medicine

## 2020-01-18 ENCOUNTER — Encounter (HOSPITAL_COMMUNITY): Payer: Self-pay | Admitting: Emergency Medicine

## 2020-01-18 ENCOUNTER — Emergency Department (HOSPITAL_COMMUNITY): Payer: Self-pay

## 2020-01-18 ENCOUNTER — Other Ambulatory Visit: Payer: Self-pay

## 2020-01-18 DIAGNOSIS — Z79899 Other long term (current) drug therapy: Secondary | ICD-10-CM | POA: Insufficient documentation

## 2020-01-18 DIAGNOSIS — G43909 Migraine, unspecified, not intractable, without status migrainosus: Secondary | ICD-10-CM | POA: Insufficient documentation

## 2020-01-18 DIAGNOSIS — H43391 Other vitreous opacities, right eye: Secondary | ICD-10-CM | POA: Insufficient documentation

## 2020-01-18 DIAGNOSIS — Z9049 Acquired absence of other specified parts of digestive tract: Secondary | ICD-10-CM | POA: Insufficient documentation

## 2020-01-18 DIAGNOSIS — G43009 Migraine without aura, not intractable, without status migrainosus: Secondary | ICD-10-CM

## 2020-01-18 LAB — BASIC METABOLIC PANEL
Anion gap: 10 (ref 5–15)
BUN: 12 mg/dL (ref 6–20)
CO2: 25 mmol/L (ref 22–32)
Calcium: 9.1 mg/dL (ref 8.9–10.3)
Chloride: 107 mmol/L (ref 98–111)
Creatinine, Ser: 0.66 mg/dL (ref 0.44–1.00)
GFR calc Af Amer: >60 mL/min (ref >60)
GFR calc non Af Amer: >60 mL/min (ref >60)
Glucose, Bld: 97 mg/dL (ref 70–99)
Potassium: 3.4 mmol/L — ABNORMAL LOW (ref 3.5–5.1)
Sodium: 142 mmol/L (ref 135–145)

## 2020-01-18 LAB — CBC WITH DIFFERENTIAL/PLATELET
Abs Immature Granulocytes: 0.01 10*3/uL (ref 0.00–0.07)
Basophils Absolute: 0 10*3/uL (ref 0.0–0.1)
Basophils Relative: 1 %
Eosinophils Absolute: 0.1 10*3/uL (ref 0.0–0.5)
Eosinophils Relative: 2 %
HCT: 40.1 % (ref 36.0–46.0)
Hemoglobin: 12.6 g/dL (ref 12.0–15.0)
Immature Granulocytes: 0 %
Lymphocytes Relative: 51 %
Lymphs Abs: 2.9 10*3/uL (ref 0.7–4.0)
MCH: 28.6 pg (ref 26.0–34.0)
MCHC: 31.4 g/dL (ref 30.0–36.0)
MCV: 90.9 fL (ref 80.0–100.0)
Monocytes Absolute: 0.5 10*3/uL (ref 0.1–1.0)
Monocytes Relative: 9 %
Neutro Abs: 2.1 10*3/uL (ref 1.7–7.7)
Neutrophils Relative %: 37 %
Platelets: 202 10*3/uL (ref 150–400)
RBC: 4.41 MIL/uL (ref 3.87–5.11)
RDW: 12.6 % (ref 11.5–15.5)
WBC: 5.6 10*3/uL (ref 4.0–10.5)
nRBC: 0 % (ref 0.0–0.2)

## 2020-01-18 MED ORDER — SODIUM CHLORIDE 0.9 % IV BOLUS
1000.0000 mL | Freq: Once | INTRAVENOUS | Status: AC
  Administered 2020-01-18: 1000 mL via INTRAVENOUS

## 2020-01-18 MED ORDER — DIPHENHYDRAMINE HCL 50 MG/ML IJ SOLN
50.0000 mg | Freq: Once | INTRAMUSCULAR | Status: AC
  Administered 2020-01-18: 50 mg via INTRAVENOUS
  Filled 2020-01-18: qty 1

## 2020-01-18 MED ORDER — KETOROLAC TROMETHAMINE 15 MG/ML IJ SOLN
15.0000 mg | Freq: Once | INTRAMUSCULAR | Status: AC
  Administered 2020-01-18: 15 mg via INTRAVENOUS
  Filled 2020-01-18: qty 1

## 2020-01-18 MED ORDER — PROCHLORPERAZINE EDISYLATE 10 MG/2ML IJ SOLN
10.0000 mg | Freq: Once | INTRAMUSCULAR | Status: AC
  Administered 2020-01-18: 10 mg via INTRAVENOUS
  Filled 2020-01-18: qty 2

## 2020-01-18 NOTE — Discharge Instructions (Signed)
Please call to schedule an appointment with Ophthalmologist Dr. Dione Booze for further evaluation of the float in your right eye Follow up with [facility] for your migraine headaches Return to the ED for any worsening symptoms

## 2020-01-18 NOTE — ED Notes (Signed)
Pt verbalized understanding of discharge instructions. Follow up care reviewed. Pt had no further questions and ambulated independently to lobby.

## 2020-01-18 NOTE — ED Notes (Signed)
Unable to obtain visual acuity screen due to pt being asleep after receiving benadryl. Will do screening when pt is awake.

## 2020-01-18 NOTE — ED Triage Notes (Signed)
Using [place] translator, pt reports severe HA for 5 days and vision trouble in right eye for 3 days. States she sees a black floater in her right eye. Hx of headaches a year ago.

## 2020-01-18 NOTE — ED Provider Notes (Signed)
[facility] EMERGENCY DEPARTMENT Provider Note   CSN: [id number] Arrival date & time: 01/18/20  0827     History Chief Complaint  Patient presents with  . Headache    Alison Stanton is a 49 y.o. female with PMHx migraine headaches who presents to the ED today complaining of a gradual onset, constant, achy/throbbing, right sided headache x 5 days. Pt also complains of a black floater in her right visual field however denies blurry vision, double vision, loss of vision. She reports her headache feels typical of her migraines however she has never had this floater before and she reports it is getting worse. Pt has been taking Advil at home without relief. She denies fevers, chills, nausea, vomiting, neck stiffness, rash, unilateral weakness or numbness, speech difficulties, or any other associated symptoms.   The history is provided by the patient and medical records. The history is limited by a language barrier. A language interpreter was used.       Past Medical History:  Diagnosis Date  . Chronic headaches 05/28/2017  . 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 Environmental and seasonal allergies 05/28/2017    Patient Active Problem List   Diagnosis Date Noted  . 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 Chronic headaches 05/28/2017  . Environmental and seasonal allergies 05/28/2017    Past Surgical History:  Procedure Laterality Date  . CHOLECYSTECTOMY  04/2016   [place]aparoscopic--[facility]  [place]     OB History   No obstetric history on file.     Family History  Problem Relation Age of Onset  . Hypertension Mother   . Headache Father   . Hypertension Father     Social History   Tobacco Use  . Smoking status: Never Smoker  . Smokeless tobacco: Never Used  Substance Use Topics  . Alcohol use: No  . Drug use: No    Home Medications Prior to Admission medications   Medication Sig Start Date End Date Taking? Authorizing Provider    cyclobenzaprine (FLEXERIL) 5 MG tablet Take 1-2 tablets (5-10 mg total) by mouth 2 (two) times daily as needed for muscle spasms. 03/08/19   Wieters, Hallie C, PA-C  diclofenac sodium (VOLTAREN) 1 % GEL Apply up to 4 g, 4 times daily as needed for shoulder pain 09/22/18   Fulp, Cammie, MD  famotidine (PEPCID) 20 MG tablet Take 1 tablet (20 mg total) by mouth 2 (two) times daily for 14 days. 07/05/18 09/22/18  Couture, Cortni S, PA-C  fluticasone (FLONASE) 50 MCG/ACT nasal spray Place 1 spray into both nostrils daily. 02/23/19   Jeannie Fend, PA-C  ibuprofen (ADVIL) 800 MG tablet Take 1 tablet (800 mg total) by mouth 3 (three) times daily. 06/09/19   Wieters, Hallie C, PA-C  montelukast (SINGULAIR) 10 MG tablet Take 1 tablet (10 mg total) by mouth at bedtime. 02/23/19   Jeannie Fend, PA-C  naproxen (NAPROSYN) 500 MG tablet Take 1 tablet (500 mg total) by mouth 2 (two) times daily. 05/20/19   Claude Manges, PA-C  olopatadine (PATANOL) 0.1 % ophthalmic solution Place 1 drop into both eyes 2 (two) times daily. 03/08/19   Wieters, Hallie C, PA-C  omeprazole (PRILOSEC) 40 MG capsule Take 1 capsule (40 mg total) by mouth daily. To reduce stomach acid 09/22/18   Fulp, Cammie, MD  ondansetron (ZOFRAN ODT) 4 MG disintegrating tablet Take 1 tablet (4 mg total) by mouth every 8 (eight) hours as needed for nausea or vomiting. 06/09/19   Sharyon Cable,  Hallie C, PA-C  rizatriptan (MAXALT) 10 MG tablet Take 1 tablet (10 mg total) by mouth as needed for migraine. May repeat in 2 hours if needed; max 4 pills/24 hours 09/22/18   Fulp, Cammie, MD  topiramate (TOPAMAX) 25 MG tablet Take 1 tablet (25 mg total) by mouth at bedtime. To help prevent headaches 09/22/18   Cain Saupe, MD    Allergies    Patient has no known allergies.  Review of Systems   Review of Systems  Constitutional: Negative for chills and fever.  Eyes: Positive for photophobia and visual disturbance (black floater in R eye field). Negative for pain.   Respiratory: Negative for cough and shortness of breath.   Cardiovascular: Negative for chest pain.  Gastrointestinal: Negative for abdominal pain, nausea and vomiting.  Neurological: Positive for headaches. Negative for dizziness and light-headedness.  All other systems reviewed and are negative.   Physical Exam Updated Vital Signs BP 131/81   Pulse 81   Temp 97.7 F (36.5 C) (Oral)   Resp 16   SpO2 100%   Physical Exam Vitals and nursing note reviewed.  Constitutional:      Appearance: She is not ill-appearing.  HENT:     Head: Normocephalic and atraumatic.  Eyes:     General: No visual field deficit.    Extraocular Movements: Extraocular movements intact.     Conjunctiva/sclera: Conjunctivae normal.     Pupils: Pupils are equal, round, and reactive to light.     Comments: Visual Acuity Bilateral Distance:20/20 R Distance:20/40 L Distance:20/32  Neck:     Meningeal: Brudzinski's sign and Kernig's sign absent.  Cardiovascular:     Rate and Rhythm: Normal rate and regular rhythm.  Pulmonary:     Effort: Pulmonary effort is normal.     Breath sounds: Normal breath sounds.  Abdominal:     Palpations: Abdomen is soft.     Tenderness: There is no abdominal tenderness.  Musculoskeletal:     Cervical back: Neck supple.  Skin:    General: Skin is warm and dry.  Neurological:     Mental Status: She is alert.     Comments: CN 3-12 grossly intact A&O x4 GCS 15 Sensation and strength intact Gait nonataxic including with tandem walking Coordination with finger-to-nose WNL Neg romberg, neg pronator drift     ED Results / Procedures / Treatments   Labs (all labs ordered are listed, but only abnormal results are displayed) Labs Reviewed  BASIC METABOLIC PANEL - Abnormal; Notable for the following components:      Result Value   Potassium 3.4 (*)    All other components within normal limits  CBC WITH DIFFERENTIAL/PLATELET    EKG None  Radiology CT Head Wo  Contrast  Result Date: 01/18/2020 CLINICAL DATA:  Worsening of chronic headaches EXAM: CT HEAD WITHOUT CONTRAST TECHNIQUE: Contiguous axial images were obtained from the base of the skull through the vertex without intravenous contrast. COMPARISON:  None. FINDINGS: Brain: No acute infarct or hemorrhage. Lateral ventricles and midline structures appear unremarkable. No acute extra-axial fluid collections. No mass effect. Vascular: No hyperdense vessel or unexpected calcification. Skull: Normal. Negative for fracture or focal lesion. Sinuses/Orbits: There is mucoperiosteal thickening within the left sphenoid sinus and bilateral maxillary sinuses. Remaining paranasal sinuses and mastoid air cells are unremarkable. Other: None IMPRESSION: 1. Mild left sphenoid and bilateral maxillary sinus disease. 2. Otherwise no acute intracranial process. Electronically Signed   By: Sharlet Salina M.D.   On: 01/18/2020 10:52  Procedures Procedures (including critical care time)  Medications Ordered in ED Medications  sodium chloride 0.9 % bolus 1,000 mL (0 mLs Intravenous Stopped 01/18/20 1143)  prochlorperazine (COMPAZINE) injection 10 mg (10 mg Intravenous Given 01/18/20 0919)  diphenhydrAMINE (BENADRYL) injection 50 mg (50 mg Intravenous Given 01/18/20 0917)  ketorolac (TORADOL) 15 MG/ML injection 15 mg (15 mg Intravenous Given 01/18/20 1200)    ED Course  I have reviewed the triage vital signs and the nursing notes.  Pertinent labs & imaging results that were available during my care of the patient were reviewed by me and considered in my medical decision making (see chart for details).  49 year old French-speaking female who presents to the ED today complaining of a headache that is typical for her migraine headaches for the past 5 days.  She reports what is atypical for her as a floater in her right visual field.  She denies loss of vision.  Denies pain with extraocular movements.  On arrival to the ED patient is  afebrile, nontachycardic and nontachypneic.  She has no focal neuro deficits on exam today.  No visual field defects appreciated.  Visual acuity within normal limits; slightly decreased on R side compared to L. Will obtain CT head given complaints and screening labs. Per chart review pt has been evaluated In the past by PCP and in the ED for migraine headaches with noted reports of blurry vision/visual changes. Will treat headache with cocktail. Toradol held until after CT scan.   CBC without leukocytosis. Hgb stable.  BMP with potassium 3.4; no other electrolyte abnormalities. Discussed increasing potassium in diet outpatient.   CT head negative. Toradol ordered.   On reevaluation pt states her headache has improved. Suspect her symptoms are related to her migraine headache however will have patient follow up with ophtho outpatient for further eval of floater in eye. Pt discharged home at this time with close PCP follow up. Strict return precautions discussed. Pt is in agreement with plan and stable for discharge home.   This note was prepared using Dragon voice recognition software and may include unintentional dictation errors due to the inherent limitations of voice recognition software.    MDM Rules/Calculators/A&P                       Final Clinical Impression(s) / ED Diagnoses Final diagnoses:  Migraine without aura and without status migrainosus, not intractable  Floaters, right    Rx / DC Orders ED Discharge Orders    None       Discharge Instructions     Please call to schedule an appointment with Ophthalmologist Dr. Katy Fitch for further evaluation of the float in your right eye Follow up with [facility] for your migraine headaches Return to the ED for any worsening symptoms       Eustaquio Maize, PA-C 01/18/20 [place]neil, Kevin, MD 01/20/20 [phone]

## 2020-08-04 ENCOUNTER — Ambulatory Visit (HOSPITAL_COMMUNITY)
Admission: EM | Admit: 2020-08-04 | Discharge: 2020-08-04 | Disposition: A | Discharge status: Home / Self Care | Payer: Self-pay | Attending: Family Medicine | Admitting: Family Medicine

## 2020-08-04 ENCOUNTER — Encounter (HOSPITAL_COMMUNITY): Payer: Self-pay

## 2020-08-04 ENCOUNTER — Other Ambulatory Visit: Payer: Self-pay

## 2020-08-04 DIAGNOSIS — M5441 Lumbago with sciatica, right side: Secondary | ICD-10-CM

## 2020-08-04 DIAGNOSIS — R109 Unspecified abdominal pain: Secondary | ICD-10-CM

## 2020-08-04 LAB — POCT URINALYSIS DIPSTICK, ED / UC
Bilirubin Urine: NEGATIVE
Glucose, UA: NEGATIVE mg/dL
Ketones, ur: NEGATIVE mg/dL
Leukocytes,Ua: NEGATIVE
Nitrite: NEGATIVE
Protein, ur: NEGATIVE mg/dL
Specific Gravity, Urine: 1.025 (ref 1.005–1.030)
Urobilinogen, UA: 0.2 mg/dL (ref 0.0–1.0)
pH: 5.5 (ref 5.0–8.0)

## 2020-08-04 MED ORDER — KETOROLAC TROMETHAMINE 30 MG/ML IJ SOLN
30.0000 mg | Freq: Once | INTRAMUSCULAR | Status: AC
  Administered 2020-08-04: 30 mg via INTRAMUSCULAR

## 2020-08-04 MED ORDER — KETOROLAC TROMETHAMINE 30 MG/ML IJ SOLN
INTRAMUSCULAR | Status: AC
  Filled 2020-08-04: qty 1

## 2020-08-04 MED ORDER — IBUPROFEN 600 MG PO TABS: 600.0000 mg | ORAL_TABLET | Freq: Three times a day (TID) | ORAL | 0 refills | Status: DC | PRN

## 2020-08-04 MED ORDER — TIZANIDINE HCL 4 MG PO TABS: 4.0000 mg | ORAL_TABLET | Freq: Four times a day (QID) | ORAL | 0 refills | Status: DC | PRN

## 2020-08-04 NOTE — ED Triage Notes (Signed)
Pt c/o 10/10 burning pain right flank pain radiating into RLQ, LLQ of abdomenx2 days. Pt denies urine issues.

## 2020-08-04 NOTE — Discharge Instructions (Addendum)
Medicine as prescribed °Follow up as needed for continued or worsening symptoms ° °

## 2020-08-07 NOTE — ED Provider Notes (Signed)
[facility]    CSN: [id number] Arrival date & time: 08/04/20  0102      History   Chief Complaint Chief Complaint  Patient presents with  . Flank Pain    HPI Alison Stanton is a 49 y.o. female.   Patient is a 49 year old female presents today with lower back pain with radiation to right buttocks, groin and right upper thigh area.  This has been present for the past 2 days.  Denies any dysuria, hematuria, urinary frequency.  Denies any fever or chills.  Pain is worse with certain movements and palpation.     Past Medical History:  Diagnosis Date  . Chronic headaches 05/28/2017  . 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 Environmental and seasonal allergies 05/28/2017    Patient Active Problem List   Diagnosis Date Noted  . 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 Chronic headaches 05/28/2017  . Environmental and seasonal allergies 05/28/2017    Past Surgical History:  Procedure Laterality Date  . CHOLECYSTECTOMY  04/2016   [place]oscopic--[facility]  [place]    OB History   No obstetric history on file.      Home Medications    Prior to Admission medications   Medication Sig Start Date End Date Taking? Authorizing Provider  ibuprofen (ADVIL) 600 MG tablet Take 1 tablet (600 mg total) by mouth every 8 (eight) hours as needed for moderate pain. 08/04/20   Dahlia Byes A, NP  tiZANidine (ZANAFLEX) 4 MG tablet Take 1 tablet (4 mg total) by mouth every 6 (six) hours as needed for muscle spasms. 08/04/20   Dahlia Byes A, NP  famotidine (PEPCID) 20 MG tablet Take 1 tablet (20 mg total) by mouth 2 (two) times daily for 14 days. 07/05/18 08/04/20  Couture, Cortni S, PA-C  fluticasone (FLONASE) 50 MCG/ACT nasal spray Place 1 spray into both nostrils daily. 02/23/19 08/04/20  Jeannie Fend, PA-C  montelukast (SINGULAIR) 10 MG tablet Take 1 tablet (10 mg total) by mouth at bedtime. 02/23/19 08/04/20  Jeannie Fend, PA-C  omeprazole (PRILOSEC) 40 MG capsule Take 1 capsule  (40 mg total) by mouth daily. To reduce stomach acid 09/22/18 08/04/20  Fulp, Cammie, MD  rizatriptan (MAXALT) 10 MG tablet Take 1 tablet (10 mg total) by mouth as needed for migraine. May repeat in 2 hours if needed; max 4 pills/24 hours 09/22/18 08/04/20  Fulp, Cammie, MD  topiramate (TOPAMAX) 25 MG tablet Take 1 tablet (25 mg total) by mouth at bedtime. To help prevent headaches 09/22/18 08/04/20  Cain Saupe, MD    Family History Family History  Problem Relation Age of Onset  . Hypertension Mother   . Headache Father   . Hypertension Father     Social History Social History   Tobacco Use  . Smoking status: Never Smoker  . Smokeless tobacco: Never Used  Vaping Use  . Vaping Use: Never used  Substance Use Topics  . Alcohol use: No  . Drug use: No     Allergies   Patient has no known allergies.   Review of Systems Review of Systems   Physical Exam Triage Vital Signs ED Triage Vitals  Enc Vitals Group     BP 08/04/20 0854 (!) 168/85     Pulse Rate 08/04/20 0854 75     Resp 08/04/20 0854 16     Temp 08/04/20 0854 (!) 97.5 F (36.4 C)     Temp Source 08/04/20 0854 Oral     SpO2 08/04/20 0854  98 %     Weight 08/04/20 0855 180 lb (81.6 kg)     Height 08/04/20 0855 5' 2.5" (1.588 m)     Head Circumference --      Peak Flow --      Pain Score 08/04/20 0855 10     Pain Loc --      Pain Edu? --      Excl. in GC? --    No data found.  Updated Vital Signs BP (!) 168/85   Pulse 75   Temp (!) 97.5 F (36.4 C) (Oral)   Resp 16   Ht 5' 2.5" (1.588 m)   Wt 180 lb (81.6 kg)   SpO2 98%   BMI 32.40 kg/m   Visual Acuity Right Eye Distance:   Left Eye Distance:   Bilateral Distance:    Right Eye Near:   Left Eye Near:    Bilateral Near:     Physical Exam Vitals and nursing note reviewed.  Constitutional:      General: She is not in acute distress.    Appearance: Normal appearance. She is not ill-appearing, toxic-appearing or diaphoretic.  HENT:     Head:  Normocephalic.     Nose: Nose normal.  Eyes:     Conjunctiva/sclera: Conjunctivae normal.  Pulmonary:     Effort: Pulmonary effort is normal.  Musculoskeletal:     Cervical back: Normal range of motion.     Lumbar back: Positive right straight leg raise test.       Back:  Skin:    General: Skin is warm and dry.     Findings: No rash.  Neurological:     Mental Status: She is alert.  Psychiatric:        Mood and Affect: Mood normal.      UC Treatments / Results  Labs (all labs ordered are listed, but only abnormal results are displayed) Labs Reviewed  POCT URINALYSIS DIPSTICK, ED / UC - Abnormal; Notable for the following components:      Result Value   Hgb urine dipstick TRACE (*)    All other components within normal limits    EKG   Radiology No results found.  Procedures Procedures (including critical care time)  Medications Ordered in UC Medications  ketorolac (TORADOL) 30 MG/ML injection 30 mg (30 mg Intramuscular Given 08/04/20 0939)    Initial Impression / Assessment and Plan / UC Course  I have reviewed the triage vital signs and the nursing notes.  Pertinent labs & imaging results that were available during my care of the patient were reviewed by me and considered in my medical decision making (see chart for details).     Back pain with right-sided sciatica 600 mg ibuprofen every 8 hours along with Zanaflex for muscle relaxant. Rest, alternate heat and ice. Follow up as needed for continued or worsening symptoms  Final Clinical Impressions(s) / UC Diagnoses   Final diagnoses:  Acute midline low back pain with right-sided sciatica     Discharge Instructions     Medicine as prescribed Follow up as needed for continued or worsening symptoms     ED Prescriptions    Medication Sig Dispense Auth. Provider   ibuprofen (ADVIL) 600 MG tablet Take 1 tablet (600 mg total) by mouth every 8 (eight) hours as needed for moderate pain. 30 tablet Mouhamadou Gittleman,  Chari Parmenter A, NP   tiZANidine (ZANAFLEX) 4 MG tablet Take 1 tablet (4 mg total) by mouth every 6 (six) hours as  needed for muscle spasms. 30 tablet Dahlia Byes A, NP     PDMP not reviewed this encounter.   Dahlia Byes A, NP 08/07/20 [phone]

## 2020-08-24 ENCOUNTER — Ambulatory Visit (INDEPENDENT_AMBULATORY_CARE_PROVIDER_SITE_OTHER): Payer: Self-pay

## 2020-08-24 ENCOUNTER — Ambulatory Visit (HOSPITAL_COMMUNITY)
Admission: EM | Admit: 2020-08-24 | Discharge: 2020-08-24 | Disposition: A | Discharge status: Home / Self Care | Payer: Self-pay | Attending: Family Medicine | Admitting: Family Medicine

## 2020-08-24 ENCOUNTER — Other Ambulatory Visit: Payer: Self-pay

## 2020-08-24 ENCOUNTER — Encounter (HOSPITAL_COMMUNITY): Payer: Self-pay

## 2020-08-24 DIAGNOSIS — R109 Unspecified abdominal pain: Secondary | ICD-10-CM

## 2020-08-24 DIAGNOSIS — M549 Dorsalgia, unspecified: Secondary | ICD-10-CM

## 2020-08-24 DIAGNOSIS — R1011 Right upper quadrant pain: Secondary | ICD-10-CM | POA: Insufficient documentation

## 2020-08-24 DIAGNOSIS — R1110 Vomiting, unspecified: Secondary | ICD-10-CM

## 2020-08-24 LAB — POCT URINALYSIS DIPSTICK, ED / UC
Bilirubin Urine: NEGATIVE
Glucose, UA: NEGATIVE mg/dL
Ketones, ur: NEGATIVE mg/dL
Leukocytes,Ua: NEGATIVE
Nitrite: NEGATIVE
Protein, ur: NEGATIVE mg/dL
Specific Gravity, Urine: 1.025 (ref 1.005–1.030)
Urobilinogen, UA: 0.2 mg/dL (ref 0.0–1.0)
pH: 5.5 (ref 5.0–8.0)

## 2020-08-24 LAB — COMPREHENSIVE METABOLIC PANEL
ALT: 19 U/L (ref 0–44)
AST: 19 U/L (ref 15–41)
Albumin: 4 g/dL (ref 3.5–5.0)
Alkaline Phosphatase: 64 U/L (ref 38–126)
Anion gap: 10 (ref 5–15)
BUN: 12 mg/dL (ref 6–20)
CO2: 28 mmol/L (ref 22–32)
Calcium: 9.9 mg/dL (ref 8.9–10.3)
Chloride: 103 mmol/L (ref 98–111)
Creatinine, Ser: 0.65 mg/dL (ref 0.44–1.00)
GFR, Estimated: >60 mL/min (ref >60)
Glucose, Bld: 90 mg/dL (ref 70–99)
Potassium: 4.5 mmol/L (ref 3.5–5.1)
Sodium: 141 mmol/L (ref 135–145)
Total Bilirubin: 0.7 mg/dL (ref 0.3–1.2)
Total Protein: 7.7 g/dL (ref 6.5–8.1)

## 2020-08-24 LAB — CBC WITH DIFFERENTIAL/PLATELET
Abs Immature Granulocytes: 0.01 10*3/uL (ref 0.00–0.07)
Basophils Absolute: 0 10*3/uL (ref 0.0–0.1)
Basophils Relative: 1 %
Eosinophils Absolute: 0.1 10*3/uL (ref 0.0–0.5)
Eosinophils Relative: 2 %
HCT: 41.5 % (ref 36.0–46.0)
Hemoglobin: 13.2 g/dL (ref 12.0–15.0)
Immature Granulocytes: 0 %
Lymphocytes Relative: 49 %
Lymphs Abs: 2.6 10*3/uL (ref 0.7–4.0)
MCH: 28.1 pg (ref 26.0–34.0)
MCHC: 31.8 g/dL (ref 30.0–36.0)
MCV: 88.5 fL (ref 80.0–100.0)
Monocytes Absolute: 0.4 10*3/uL (ref 0.1–1.0)
Monocytes Relative: 8 %
Neutro Abs: 2.1 10*3/uL (ref 1.7–7.7)
Neutrophils Relative %: 40 %
Platelets: 218 10*3/uL (ref 150–400)
RBC: 4.69 MIL/uL (ref 3.87–5.11)
RDW: 13 % (ref 11.5–15.5)
WBC: 5.2 10*3/uL (ref 4.0–10.5)
nRBC: 0 % (ref 0.0–0.2)

## 2020-08-24 LAB — AMYLASE: Amylase: 84 U/L (ref 28–100)

## 2020-08-24 LAB — LIPASE, BLOOD: Lipase: 37 U/L (ref 11–51)

## 2020-08-24 MED ORDER — KETOROLAC TROMETHAMINE 30 MG/ML IJ SOLN
INTRAMUSCULAR | Status: AC
  Filled 2020-08-24: qty 1

## 2020-08-24 MED ORDER — POLYETHYLENE GLYCOL 3350 17 G PO PACK: 17.0000 g | PACK | Freq: Every day | ORAL | 0 refills | Status: DC

## 2020-08-24 MED ORDER — KETOROLAC TROMETHAMINE 30 MG/ML IJ SOLN
30.0000 mg | Freq: Once | INTRAMUSCULAR | Status: AC
  Administered 2020-08-24: 30 mg via INTRAMUSCULAR

## 2020-08-24 NOTE — Discharge Instructions (Addendum)
I am concerned this is a gallbladder issue but you also have some stool back up in the upper abdomen. Constipation may be causing this. We will treat with miralax over the next few days until you get a good bowel movement.  I am obtaining some blood work.  You may need an ultrasound if the pain continues.  If your pain worsens or you have nausea, vomiting you need to go to the ER.  Referral put in for GI, they should be calling to schedule an appointment.  You need a primary care doctor I have out some contacts on our discharge instruction.

## 2020-08-24 NOTE — ED Triage Notes (Signed)
Pt presents with generalized abdominal pain that radiates around to back with some vomiting for over 2 weeks that is unrelieved with prescribed medication.

## 2020-08-25 NOTE — ED Provider Notes (Signed)
[facility]    CSN: [id number] Arrival date & time: 08/24/20  1051      History   Chief Complaint Chief Complaint  Patient presents with  . Abdominal Pain  . Back Pain    HPI Alison Stanton is a 49 y.o. female.   Pt is a 49 year old female, french speaking. She presents today with generalized abdominal pain, more in the RUQ area. This has been worsening over the past few weeks. Was seen here and treated for sciatic nerve pain on 9/24. No nausea, vomiting, diarrhea. No fever. The medicines she was prescribed previously are not helping. Reporting regular, non bloody bowel movements.      Past Medical History:  Diagnosis Date  . Chronic headaches 05/28/2017  . 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 Environmental and seasonal allergies 05/28/2017    Patient Active Problem List   Diagnosis Date Noted  . 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 Chronic headaches 05/28/2017  . Environmental and seasonal allergies 05/28/2017    Past Surgical History:  Procedure Laterality Date  . CHOLECYSTECTOMY  04/2016   [place]oscopic--[facility]  [place]    OB History   No obstetric history on file.      Home Medications    Prior to Admission medications   Medication Sig Start Date End Date Taking? Authorizing Provider  ibuprofen (ADVIL) 600 MG tablet Take 1 tablet (600 mg total) by mouth every 8 (eight) hours as needed for moderate pain. 08/04/20   Somnang Mahan, Gloris Manchester A, NP  polyethylene glycol (MIRALAX / GLYCOLAX) 17 g packet Take 17 g by mouth daily. 08/24/20   Parrie Rasco, Gloris Manchester A, NP  tiZANidine (ZANAFLEX) 4 MG tablet Take 1 tablet (4 mg total) by mouth every 6 (six) hours as needed for muscle spasms. 08/04/20   Dahlia Byes A, NP  famotidine (PEPCID) 20 MG tablet Take 1 tablet (20 mg total) by mouth 2 (two) times daily for 14 days. 07/05/18 08/04/20  Couture, Cortni S, PA-C  fluticasone (FLONASE) 50 MCG/ACT nasal spray Place 1 spray into both nostrils daily. 02/23/19 08/04/20  Jeannie Fend, PA-C  montelukast (SINGULAIR) 10 MG tablet Take 1 tablet (10 mg total) by mouth at bedtime. 02/23/19 08/04/20  Jeannie Fend, PA-C  omeprazole (PRILOSEC) 40 MG capsule Take 1 capsule (40 mg total) by mouth daily. To reduce stomach acid 09/22/18 08/04/20  Fulp, Cammie, MD  rizatriptan (MAXALT) 10 MG tablet Take 1 tablet (10 mg total) by mouth as needed for migraine. May repeat in 2 hours if needed; max 4 pills/24 hours 09/22/18 08/04/20  Fulp, Cammie, MD  topiramate (TOPAMAX) 25 MG tablet Take 1 tablet (25 mg total) by mouth at bedtime. To help prevent headaches 09/22/18 08/04/20  Cain Saupe, MD    Family History Family History  Problem Relation Age of Onset  . Hypertension Mother   . Headache Father   . Hypertension Father     Social History Social History   Tobacco Use  . Smoking status: Never Smoker  . Smokeless tobacco: Never Used  Vaping Use  . Vaping Use: Never used  Substance Use Topics  . Alcohol use: No  . Drug use: No     Allergies   Patient has no known allergies.   Review of Systems Review of Systems   Physical Exam Triage Vital Signs ED Triage Vitals  Enc Vitals Group     BP 08/24/20 1226 112/78     Pulse Rate 08/24/20 1226 74  Resp 08/24/20 1226 20     Temp 08/24/20 1226 98.9 F (37.2 C)     Temp Source 08/24/20 1226 Oral     SpO2 08/24/20 1226 100 %     Weight --      Height --      Head Circumference --      Peak Flow --      Pain Score 08/24/20 1224 6     Pain Loc --      Pain Edu? --      Excl. in GC? --    No data found.  Updated Vital Signs BP 112/78 (BP Location: Right Arm)   Pulse 74   Temp 98.9 F (37.2 C) (Oral)   Resp 20   LMP 08/07/2020   SpO2 100%   Visual Acuity Right Eye Distance:   Left Eye Distance:   Bilateral Distance:    Right Eye Near:   Left Eye Near:    Bilateral Near:     Physical Exam Vitals and nursing note reviewed.  Constitutional:      General: She is not in acute distress.     Appearance: Normal appearance. She is not ill-appearing, toxic-appearing or diaphoretic.  HENT:     Head: Normocephalic.     Nose: Nose normal.  Eyes:     Conjunctiva/sclera: Conjunctivae normal.  Pulmonary:     Effort: Pulmonary effort is normal.  Abdominal:     General: Abdomen is flat. Bowel sounds are decreased.     Palpations: Abdomen is soft. There is no hepatomegaly or splenomegaly.     Tenderness: There is abdominal tenderness in the right upper quadrant. Positive signs include Murphy's sign.     Hernia: No hernia is present.  Musculoskeletal:        General: Normal range of motion.     Cervical back: Normal range of motion.  Skin:    General: Skin is warm and dry.     Findings: No rash.  Neurological:     Mental Status: She is alert.  Psychiatric:        Mood and Affect: Mood normal.      UC Treatments / Results  Labs (all labs ordered are listed, but only abnormal results are displayed) Labs Reviewed  POCT URINALYSIS DIPSTICK, ED / UC - Abnormal; Notable for the following components:      Result Value   Hgb urine dipstick TRACE (*)    All other components within normal limits  CBC WITH DIFFERENTIAL/PLATELET  COMPREHENSIVE METABOLIC PANEL  LIPASE, BLOOD  AMYLASE    EKG   Radiology DG Abd 1 View  Result Date: 08/24/2020 CLINICAL DATA:  Abdominal pain and vomiting for the past 2 weeks. EXAM: ABDOMEN - 1 VIEW COMPARISON:  None. FINDINGS: The bowel gas pattern is normal. No radio-opaque calculi or other significant radiographic abnormality are seen. Prior cholecystectomy. No acute osseous abnormality. Dystrophic calcification over the right lower quadrant, likely subcutaneous. IMPRESSION: 1. No acute findings. Electronically Signed   By: Obie Dredge M.D.   On: 08/24/2020 13:26    Procedures Procedures (including critical care time)  Medications Ordered in UC Medications  ketorolac (TORADOL) 30 MG/ML injection 30 mg (30 mg Intramuscular Given 08/24/20  1313)    Initial Impression / Assessment and Plan / UC Course  I have reviewed the triage vital signs and the nursing notes.  Pertinent labs & imaging results that were available during my care of the patient were reviewed by me and considered  in my medical decision making (see chart for details).     RUQ pain Concern for cholelithiasis. Constipation could also be a cause.  Treating with miralax over the next few days to see of this helps.  Increase water.  Obtaining some blood work.  Toradol given here for pain which helped some . Large amount of stool in area of pain. Otherwise normal x ray.  Pt will need ultrasound if this problem continues.  Contact given for primary care for follow up.  referral for GI put in.  ER for worse.   Lab work unremarkable.   Final Clinical Impressions(s) / UC Diagnoses   Final diagnoses:  Right upper quadrant abdominal pain     Discharge Instructions     I am concerned this is a gallbladder issue but you also have some stool back up in the upper abdomen. Constipation may be causing this. We will treat with miralax over the next few days until you get a good bowel movement.  I am obtaining some blood work.  You may need an ultrasound if the pain continues.  If your pain worsens or you have nausea, vomiting you need to go to the ER.  Referral put in for GI, they should be calling to schedule an appointment.  You need a primary care doctor I have out some contacts on our discharge instruction.    ED Prescriptions    Medication Sig Dispense Auth. Provider   polyethylene glycol (MIRALAX / GLYCOLAX) 17 g packet Take 17 g by mouth daily. 14 each Janace Aris, NP     PDMP not reviewed this encounter.   Janace Aris, NP 08/25/20 1444

## 2020-09-08 ENCOUNTER — Encounter: Payer: Self-pay | Admitting: Physician Assistant

## 2020-09-25 ENCOUNTER — Ambulatory Visit (INDEPENDENT_AMBULATORY_CARE_PROVIDER_SITE_OTHER): Payer: Self-pay | Admitting: Physician Assistant

## 2020-09-25 ENCOUNTER — Other Ambulatory Visit (INDEPENDENT_AMBULATORY_CARE_PROVIDER_SITE_OTHER): Payer: Self-pay

## 2020-09-25 ENCOUNTER — Encounter: Payer: Self-pay | Admitting: Physician Assistant

## 2020-09-25 VITALS — BP 110/80 | HR 80 | Ht 62.5 in | Wt 188.1 lb

## 2020-09-25 DIAGNOSIS — R1013 Epigastric pain: Secondary | ICD-10-CM

## 2020-09-25 DIAGNOSIS — R1030 Lower abdominal pain, unspecified: Secondary | ICD-10-CM

## 2020-09-25 DIAGNOSIS — M545 Low back pain, unspecified: Secondary | ICD-10-CM

## 2020-09-25 LAB — BASIC METABOLIC PANEL
BUN: 10 mg/dL (ref 6–23)
CO2: 30 mEq/L (ref 19–32)
Calcium: 9.1 mg/dL (ref 8.4–10.5)
Chloride: 102 mEq/L (ref 96–112)
Creatinine, Ser: 0.66 mg/dL (ref 0.40–1.20)
GFR: 101.47 mL/min (ref >60.00)
Glucose, Bld: 83 mg/dL (ref 70–99)
Potassium: 3.9 mEq/L (ref 3.5–5.1)
Sodium: 138 mEq/L (ref 135–145)

## 2020-09-25 LAB — SEDIMENTATION RATE: Sed Rate: 19 mm/hr (ref 0–30)

## 2020-09-25 MED ORDER — OMEPRAZOLE 40 MG PO CPDR: 40.0000 mg | DELAYED_RELEASE_CAPSULE | Freq: Every day | ORAL | 6 refills | Status: DC

## 2020-09-25 NOTE — Patient Instructions (Signed)
If you are age 51 or older, your body mass index should be between 23-30. Your Body mass index is 33.86 kg/m. If this is out of the aforementioned range listed, please consider follow up with your Primary Care Provider.  If you are age 73 or younger, your body mass index should be between 19-25. Your Body mass index is 33.86 kg/m. If this is out of the aformentioned range listed, please consider follow up with your Primary Care Provider.   You have been scheduled for a CT scan of the abdomen and pelvis at [facility], 1st floor Radiology. You are scheduled on 10/03/20  at 3:30 pm. You should arrive 15 minutes prior to your appointment time for registration.  Please pick up 2 bottles of contrast from [place] at least 3 days prior to your scan. The solution may taste better if refrigerated, but do NOT add ice or any other liquid to this solution. Shake well before drinking.   Please follow the written instructions below on the day of your exam:   1) Do not eat anything after 11:30 am (4 hours prior to your test)   2) Drink 1 bottle of contrast @ 1:30 pm (2 hours prior to your exam)  Remember to shake well before drinking and do NOT pour over ice.     Drink 1 bottle of contrast @ 2:30 om (1 hour prior to your exam)   You may take any medications as prescribed with a small amount of water, if necessary. If you take any of the following medications: METFORMIN, GLUCOPHAGE, GLUCOVANCE, AVANDAMET, RIOMET, FORTAMET, [place] MET, JANUMET, GLUMETZA or METAGLIP, you MAY be asked to HOLD this medication 48 hours AFTER the exam.   The purpose of you drinking the oral contrast is to aid in the visualization of your intestinal tract. The contrast solution may cause some diarrhea. Depending on your individual set of symptoms, you may also receive an intravenous injection of x-ray contrast/dye. Plan on being at [facility] for 45 minutes or longer, depending on the type of exam you are having performed.    If you have any questions regarding your exam or if you need to reschedule, you may call [facility] at [phone] between the hours of 8:00 am and 5:00 pm, Monday-Friday.   START Omeprazole 40 mg 1 capsule every morning.  Your provider has requested that you go to the basement level for lab work before leaving today. Press "B" on the elevator. The lab is located at the first door on the left as you exit the elevator.  You have been schedule to follow up with Nicoletta Ba, PA-C on October 26, 2020 at 10:30 am.

## 2020-09-25 NOTE — Progress Notes (Signed)
Subjective:    Patient ID: Alison Stanton, female    DOB: 1971-09-28, 49 y.o.   MRN: [id number]  HPI  Alison Stanton is a 49 year old [place] female, new to GI today referred by Loura Pardon, NP/urgent care for evaluation of abdominal pain.  Patient is non-English-speaking and accompanied by an interpreter today.  She speaks [place].  Patient has not had any prior GI evaluation. She is status post cholecystectomy about 4 years ago done in [place].  Has history of chronic headaches. Patient says her current symptoms have been present for several months possibly a year.  She is complaining of a burning or heat sensation in her upper abdomen and pain which seems to have gradually progressed.  She points to her epigastrium and bilateral lower quadrants and says the pain will radiate around into her back bilaterally.  She has also noticed some pain into her legs more so in the right anterior thigh.  Appetite has been fair, weight has been stable.  She says she is unable to eat too much at a time as this will cause vomiting.  She has had some long-term problems with constipation with no recent changes, no melena or hematochezia. She has been taking some Advil for pain on a fairly regular basis.  She had been on omeprazole at one point in the past but has not been over the past few months. No recent imaging other than plain abdominal films. Baseline labs with CBC c-Met and lipase in October 2021 were unremarkable. Family history negative for GI disease as far she is aware  Review of Systems Pertinent positive and negative review of systems were noted in the above HPI section.  All other review of systems was otherwise negative.  Outpatient Encounter Medications as of 09/25/2020  Medication Sig  . ibuprofen (ADVIL) 600 MG tablet Take 1 tablet (600 mg total) by mouth every 8 (eight) hours as needed for moderate pain.  Marland Kitchen omeprazole (PRILOSEC) 40 MG capsule Take 1 capsule (40 mg total) by mouth daily.  .  [DISCONTINUED] famotidine (PEPCID) 20 MG tablet Take 1 tablet (20 mg total) by mouth 2 (two) times daily for 14 days.  . [DISCONTINUED] fluticasone (FLONASE) 50 MCG/ACT nasal spray Place 1 spray into both nostrils daily.  . [DISCONTINUED] montelukast (SINGULAIR) 10 MG tablet Take 1 tablet (10 mg total) by mouth at bedtime.  . [DISCONTINUED] omeprazole (PRILOSEC) 40 MG capsule Take 1 capsule (40 mg total) by mouth daily. To reduce stomach acid  . [DISCONTINUED] polyethylene glycol (MIRALAX / GLYCOLAX) 17 g packet Take 17 g by mouth daily.  . [DISCONTINUED] rizatriptan (MAXALT) 10 MG tablet Take 1 tablet (10 mg total) by mouth as needed for migraine. May repeat in 2 hours if needed; max 4 pills/24 hours  . [DISCONTINUED] tiZANidine (ZANAFLEX) 4 MG tablet Take 1 tablet (4 mg total) by mouth every 6 (six) hours as needed for muscle spasms.  . [DISCONTINUED] topiramate (TOPAMAX) 25 MG tablet Take 1 tablet (25 mg total) by mouth at bedtime. To help prevent headaches   No facility-administered encounter medications on file as of 09/25/2020.   No Known Allergies Patient Active Problem List   Diagnosis Date Noted  . 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 Chronic headaches 05/28/2017  . Environmental and seasonal allergies 05/28/2017   Social History   Socioeconomic History  . Marital status: Married    Spouse name: Not on file  . Number of children: 2  . Years of education: 41  .  Highest education level: Not on file  Occupational History  . Occupation: unemployed  Tobacco Use  . Smoking status: Never Smoker  . Smokeless tobacco: Never Used  Vaping Use  . Vaping Use: Never used  Substance and Sexual Activity  . Alcohol use: No  . Drug use: No  . Sexual activity: Yes  Other Topics Concern  . Not on file  Social History Narrative   Originally from [place]   Moved to [place] In [place], [place] originally.  Had her surgery, then left to go back to [place].    [place] and [place]  since.   When came back in November, came to [place] instead of [place] where she was at before.   Has 2 adopted children.   Lives with female friend of family.  Not a couple    Children, ages 51 and 27 yo live in [place]--she is hoping they will join her soon.    Social Determinants of Health   Financial Resource Strain:   . Difficulty of Paying Living Expenses: Not on file  Food Insecurity:   . Worried About Charity fundraiser in the Last Year: Not on file  . Ran Out of Food in the Last Year: Not on file  Transportation Needs:   . Lack of Transportation (Medical): Not on file  . Lack of Transportation (Non-Medical): Not on file  Physical Activity:   . Days of Exercise per Week: Not on file  . Minutes of Exercise per Session: Not on file  Stress:   . Feeling of Stress : Not on file  Social Connections:   . Frequency of Communication with Friends and Family: Not on file  . Frequency of Social Gatherings with Friends and Family: Not on file  . Attends Religious Services: Not on file  . Active Member of Clubs or Organizations: Not on file  . Attends Archivist Meetings: Not on file  . Marital Status: Not on file  Intimate Partner Violence:   . Fear of Current or Ex-Partner: Not on file  . Emotionally Abused: Not on file  . Physically Abused: Not on file  . Sexually Abused: Not on file    Alison Stanton's family history includes Diabetes in her sister; Headache in her father; Hypertension in her father and mother.      Objective:    Vitals:   09/25/20 1022  BP: 110/80  Pulse: 80    Physical Exam Well-developed well-nourished [place] female in no acute distress.  Height, Weight, 188 BMI 33.8  HEENT; nontraumatic normocephalic, EOMI, PE RR LA, sclera anicteric. Oropharynx; not examined Neck; supple, no JVD Cardiovascular; regular rate and rhythm with S1-S2, no murmur rub or gallop Pulmonary; Clear bilaterally Abdomen; soft, she is tender across the epigastrium no  guarding or rebound, also mild tenderness in the bilateral lower quadrants nondistended, no palpable mass or hepatosplenomegaly, bowel sounds are active Rectal; not done today Skin; benign exam, no jaundice rash or appreciable lesions Extremities; no clubbing cyanosis or edema skin warm and dry Neuro/Psych; alert and oriented x4, grossly nonfocal mood and affect appropriate       Assessment & Plan:   #27 49 year old [place] female non-English-speaking with complaints of abdominal pain over the past several months gradually worsening.  Pain is located in the epigastrium and bilateral lower quadrants and also complains of pain radiating around into her lower back bilaterally and some anterior leg pain.  Etiology of symptoms is not clear.  Regarding the epigastric pain rule out peptic ulcer disease, chronic gastropathy, H. pylori.  She is status post cholecystectomy. I am not certain that her lower abdominal and lower back pain is of GI etiology especially as she has noticed some radiation into her legs. Will rule out intra-abdominal inflammatory process, neoplasm.  #2 chronic headaches #3 status post cholecystectomy #4.  Colon cancer screening-no prior colonoscopy  Plan; BMET and sed rate today, check H. pylori stool antigen Patient will be scheduled for CT scan of the abdomen and pelvis with contrast Start omeprazole 40 mg p.o. every morning Patient advised to minimize use of ibuprofen. We will plan office follow-up in 3 to 4 weeks, if CT is unrevealing and symptoms are persisting she will probably need EGD with Dr. Silverio Decamp  and will also eventually need colonoscopy for screening.   Alison Stanton S Carvell Hoeffner PA-C 09/25/2020   Cc: Antony Blackbird, MD

## 2020-09-27 ENCOUNTER — Other Ambulatory Visit: Payer: Self-pay

## 2020-09-28 LAB — HELICOBACTER PYLORI  SPECIAL ANTIGEN
MICRO NUMBER:: 11215604
SPECIMEN QUALITY: ADEQUATE

## 2020-10-03 ENCOUNTER — Ambulatory Visit (HOSPITAL_COMMUNITY)
Admission: RE | Admit: 2020-10-03 | Discharge: 2020-10-03 | Disposition: A | Discharge status: Home / Self Care | Payer: Self-pay | Source: Ambulatory Visit | Attending: Physician Assistant | Admitting: Physician Assistant

## 2020-10-03 ENCOUNTER — Other Ambulatory Visit: Payer: Self-pay

## 2020-10-03 ENCOUNTER — Encounter (HOSPITAL_COMMUNITY): Payer: Self-pay

## 2020-10-03 DIAGNOSIS — R1013 Epigastric pain: Secondary | ICD-10-CM

## 2020-10-03 DIAGNOSIS — M545 Low back pain, unspecified: Secondary | ICD-10-CM

## 2020-10-03 DIAGNOSIS — R1030 Lower abdominal pain, unspecified: Secondary | ICD-10-CM

## 2020-10-03 MED ORDER — IOHEXOL 300 MG/ML  SOLN
100.0000 mL | Freq: Once | INTRAMUSCULAR | Status: AC | PRN
  Administered 2020-10-03: 100 mL via INTRAVENOUS

## 2020-10-17 NOTE — Progress Notes (Signed)
Reviewed and agree with documentation and assessment and plan. K. Veena Federica Allport , MD   

## 2020-10-26 ENCOUNTER — Ambulatory Visit (INDEPENDENT_AMBULATORY_CARE_PROVIDER_SITE_OTHER): Payer: Self-pay | Admitting: Physician Assistant

## 2020-10-26 ENCOUNTER — Encounter: Payer: Self-pay | Admitting: Physician Assistant

## 2020-10-26 VITALS — BP 110/64 | HR 96 | Wt 188.4 lb

## 2020-10-26 DIAGNOSIS — K2970 Gastritis, unspecified, without bleeding: Secondary | ICD-10-CM

## 2020-10-26 DIAGNOSIS — R1013 Epigastric pain: Secondary | ICD-10-CM

## 2020-10-26 MED ORDER — OMEPRAZOLE 40 MG PO CPDR: 40.0000 mg | DELAYED_RELEASE_CAPSULE | Freq: Every day | ORAL | 6 refills | Status: DC

## 2020-10-26 MED ORDER — ONDANSETRON HCL 4 MG PO TABS: 4.0000 mg | ORAL_TABLET | Freq: Four times a day (QID) | ORAL | 1 refills | Status: DC | PRN

## 2020-10-26 NOTE — Patient Instructions (Addendum)
Si vous avez 65 ans ou plus, votre indice de masse corporelle doit se situer entre 23 et 30. Votre indice de masse corporelle est de 33,91 kg/m. Si cela se situe en dehors de la plage mentionne ci-dessus, Therapist, sports de faire un suivi avec votre fournisseur de soins primaires.  Si vous avez 64 ans ou moins, votre indice de masse corporelle doit se situer entre 19 et 25. Votre indice de masse corporelle est de 33,91 kg/m. Si cela se situe en dehors de la plage mentionne ci-dessus, Therapist, sports de faire un suivi avec votre fournisseur de soins primaires.  Continuer Omprazole 40 mg 1 glule avant le petit djeuner COMMENCER Ondanestron 4 mg 1 comprim toutes les 6 heures au besoin pour les nauses  NE prenez PAS d'ibuprofne, prenez uniquement du Tylenol nature au besoin pour Financial planner.  Faites un suivi dans 4  6 mois ou appelez le bureau si vous devez tre vu plus tt si vos symptmes s'aggravent.  Merci de m'avoir confi vos soins et d'avoir [facility].  Amy Esterwood, PA-C

## 2020-10-29 ENCOUNTER — Encounter: Payer: Self-pay | Admitting: Physician Assistant

## 2020-10-29 NOTE — Progress Notes (Signed)
Subjective:    Patient ID: Alison Stanton, female    DOB: [birth year], 49 y.o.   MRN: [id number]  HPI Alison Stanton is a 49 year old non-English-speaking [place] female, who comes in today for follow-up.  She was initially seen here on 09/25/2020 by myself with complaints of abdominal pain which was in the bilateral lower quadrants and at times across the epigastrium and also was reporting some pain around into the lower back.  She is status post cholecystectomy.  She had not had any prior GI evaluation. She had been started on omeprazole 40 mg p.o. every morning and has undergone CT scan of the abdomen and pelvis which was unremarkable. H. pylori stool antigen has also been done and was negative. Today she says that the abdominal pain has improved and is not severe and is now mostly noted in the epigastrium when present.  She still has some low back discomfort.  Her bowel movements are normal and says that the omeprazole does seem to help her bowel movements as well.  She continues to have some mild nausea with meals, no dysphagia or odynophagia, no heartburn or indigestion.  No melena or hematochezia. She does have ibuprofen on her med list and admits that she has been taking 600 mg of ibuprofen a couple of times per day frequently.  Review of Systems Pertinent positive and negative review of systems were noted in the above HPI section.  All other review of systems was otherwise negative.  Outpatient Encounter Medications as of 10/26/2020  Medication Sig  . ibuprofen (ADVIL) 600 MG tablet Take 1 tablet (600 mg total) by mouth every 8 (eight) hours as needed for moderate pain.  . [DISCONTINUED] omeprazole (PRILOSEC) 40 MG capsule Take 1 capsule (40 mg total) by mouth daily.  Alison Stanton omeprazole (PRILOSEC) 40 MG capsule Take 1 capsule (40 mg total) by mouth daily before breakfast.  . ondansetron (ZOFRAN) 4 MG tablet Take 1 tablet (4 mg total) by mouth every 6 (six) hours as needed for nausea or vomiting.   . [DISCONTINUED] famotidine (PEPCID) 20 MG tablet Take 1 tablet (20 mg total) by mouth 2 (two) times daily for 14 days.  . [DISCONTINUED] fluticasone (FLONASE) 50 MCG/ACT nasal spray Place 1 spray into both nostrils daily.  . [DISCONTINUED] montelukast (SINGULAIR) 10 MG tablet Take 1 tablet (10 mg total) by mouth at bedtime.  . [DISCONTINUED] rizatriptan (MAXALT) 10 MG tablet Take 1 tablet (10 mg total) by mouth as needed for migraine. May repeat in 2 hours if needed; max 4 pills/24 hours  . [DISCONTINUED] topiramate (TOPAMAX) 25 MG tablet Take 1 tablet (25 mg total) by mouth at bedtime. To help prevent headaches   No facility-administered encounter medications on file as of 10/26/2020.   No Known Allergies Patient Active Problem List   Diagnosis Date Noted  . 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 Chronic headaches 05/28/2017  . Environmental and seasonal allergies 05/28/2017   Social History   Socioeconomic History  . Marital status: Married    Spouse name: Not on file  . Number of children: 2  . Years of education: 34  . Highest education level: Not on file  Occupational History  . Occupation: unemployed  Tobacco Use  . Smoking status: Never Smoker  . Smokeless tobacco: Never Used  Vaping Use  . Vaping Use: Never used  Substance and Sexual Activity  . Alcohol use: No  . Drug use: No  . Sexual activity: Yes  Other Topics Concern  . Not  on file  Social History Narrative   Originally from [place]   Moved to [place]. In [place], [place] originally.  Had her surgery, then left to go back to [place].    [place] and here since.   When came back in November, came to [place] instead of [place] where she was at before.   Has 2 adopted children.   Lives with female friend of family.  Not a couple    Children, ages 49 and 57 yo live in [place]--she is hoping they will join her soon.    Social Determinants of Health   Financial Resource Strain: Not on file  Food Insecurity:  Not on file  Transportation Needs: Not on file  Physical Activity: Not on file  Stress: Not on file  Social Connections: Not on file  Intimate Partner Violence: Not on file    Alison Stanton's family history includes Diabetes in her sister; Headache in her father; Hypertension in her father and mother.      Objective:    Vitals:   10/26/20 1108  BP: 110/64  Pulse: 96    Physical Exam Well-developed well-nourished non-English-speaking [place] female in no acute distress.  Accompanied by interpreter height, Weight, 188 BMI 33.9  HEENT; nontraumatic normocephalic, EOMI, PE R LA, sclera anicteric. Oropharynx; not examined  Extremities; no clubbing cyanosis or edema skin warm and dry Neuro/Psych; alert and oriented x4, grossly nonfocal mood and affect appropriate       Assessment & Plan:   #55  49 year old [place] female with recent epigastric and bilateral lower quadrant abdominal discomfort and nausea. Work-up thus far unrevealing with negative CT scan, negative H. pylori stool antigen. Symptoms have significantly improved on omeprazole 40 mg p.o. every morning though she still gets some occasional epigastric discomfort and nausea. Etiology of symptoms is not entirely clear though now knowing that she has been using ibuprofen fairly regularly suspect she may have an NSAID induced gastropathy.  #2 status post cholecystectomy #3 Colon cancer screening-no prior colonoscopy  Plan; discussed upper endoscopy and colonoscopy with the patient.  She understands that she is of age to undergo screening colonoscopy to screen for colon cancer and polyps.  She currently does not have any health insurance and would like to wait on procedures as she anticipates she may have insurance in the upcoming months. She feels that her symptoms are improving on the current medication and would like to continue. We discussed side effect of ibuprofen of gastric and bowel irritation and patient was advised  to discontinue all NSAIDs. Continue omeprazole 40 mg p.o. every morning AC breakfast, refill sent Zofran 4 mg every 6 hours as needed for nausea. We will plan follow-up in 4 to 5 months, happy to see her sooner in the interim if symptoms change or fail to improve.  Donice Alperin S Lucyle Alumbaugh PA-C 10/29/2020   Cc: Cain Saupe, MD

## 2020-11-17 ENCOUNTER — Ambulatory Visit (HOSPITAL_COMMUNITY)
Admission: EM | Admit: 2020-11-17 | Discharge: 2020-11-17 | Disposition: A | Discharge status: Home / Self Care | Payer: HRSA Program | Attending: Family Medicine | Admitting: Family Medicine

## 2020-11-17 ENCOUNTER — Encounter (HOSPITAL_COMMUNITY): Payer: Self-pay

## 2020-11-17 ENCOUNTER — Other Ambulatory Visit: Payer: Self-pay

## 2020-11-17 DIAGNOSIS — U071 COVID-19: Secondary | ICD-10-CM | POA: Diagnosis not present

## 2020-11-17 DIAGNOSIS — B349 Viral infection, unspecified: Secondary | ICD-10-CM | POA: Diagnosis present

## 2020-11-17 DIAGNOSIS — Z20822 Contact with and (suspected) exposure to covid-19: Secondary | ICD-10-CM | POA: Diagnosis present

## 2020-11-17 LAB — SARS CORONAVIRUS 2 (TAT 6-24 HRS): SARS Coronavirus 2: POSITIVE — AB

## 2020-11-17 MED ORDER — KETOROLAC TROMETHAMINE 30 MG/ML IJ SOLN
30.0000 mg | Freq: Once | INTRAMUSCULAR | Status: AC
  Administered 2020-11-17: 30 mg via INTRAMUSCULAR

## 2020-11-17 MED ORDER — KETOROLAC TROMETHAMINE 30 MG/ML IJ SOLN
INTRAMUSCULAR | Status: AC
  Filled 2020-11-17: qty 1

## 2020-11-17 MED ORDER — BENZONATATE 100 MG PO CAPS: 100.0000 mg | ORAL_CAPSULE | Freq: Three times a day (TID) | ORAL | 0 refills | Status: DC | PRN

## 2020-11-17 NOTE — ED Provider Notes (Signed)
[facility]    CSN: [id number] Arrival date & time: 11/17/20  2536      History   Chief Complaint Chief Complaint  Patient presents with  . Generalized Body Aches  . Cough    HPI Alison Stanton is a 50 y.o. female.   HPI  Patient presents with viral symptoms including cough, generalized body aches,  nasal congestion, and sinus pressure. Unknown of COVID exposure. COVID/FLU Vaccinated: N Denies worrisome symptoms of shortness of breath, weakness, N&V, chest pain.  Past Medical History:  Diagnosis Date  . Chronic headaches 05/28/2017  . 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 Environmental and seasonal allergies 05/28/2017  . Gallstones     Patient Active Problem List   Diagnosis Date Noted  . 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 Chronic headaches 05/28/2017  . Environmental and seasonal allergies 05/28/2017    Past Surgical History:  Procedure Laterality Date  . CHOLECYSTECTOMY  04/2016   [place]  [place]    OB History   No obstetric history on file.      Home Medications    Prior to Admission medications   Medication Sig Start Date End Date Taking? Authorizing Provider  ibuprofen (ADVIL) 600 MG tablet Take 1 tablet (600 mg total) by mouth every 8 (eight) hours as needed for moderate pain. 08/04/20   Janace Aris, NP  omeprazole (PRILOSEC) 40 MG capsule Take 1 capsule (40 mg total) by mouth daily before breakfast. 10/26/20   Esterwood, Amy S, PA-C  ondansetron (ZOFRAN) 4 MG tablet Take 1 tablet (4 mg total) by mouth every 6 (six) hours as needed for nausea or vomiting. 10/26/20   Esterwood, Amy S, PA-C    Family History Family History  Problem Relation Age of Onset  . Hypertension Mother   . Headache Father   . Hypertension Father   . Diabetes Sister     Social History Social History   Tobacco Use  . Smoking status: Never Smoker  . Smokeless tobacco: Never Used  Vaping Use  . Vaping Use: Never used  Substance Use  Topics  . Alcohol use: No  . Drug use: No     Allergies   Patient has no known allergies.   Review of Systems Review of Systems Pertinent negatives listed in HPI Physical Exam Triage Vital Signs ED Triage Vitals  Enc Vitals Group     BP 11/17/20 1112 110/90     Pulse Rate 11/17/20 1112 88     Resp 11/17/20 1112 19     Temp 11/17/20 1112 (!) 97.5 F (36.4 C)     Temp src --      SpO2 11/17/20 1112 100 %     Weight --      Height --      Head Circumference --      Peak Flow --      Pain Score 11/17/20 1114 9     Pain Loc --      Pain Edu? --      Excl. in GC? --    No data found.  Updated Vital Signs BP 110/90   Pulse 88   Temp (!) 97.5 F (36.4 C)   Resp 19   LMP 09/25/2020   SpO2 100%   Visual Acuity Right Eye Distance:   Left Eye Distance:   Bilateral Distance:    Right Eye Near:   Left Eye Near:    Bilateral Near:     Physical Exam  General Appearance:    Alert, cooperative, no distress  HENT:   Normocephalic, ears normal, nares mucosal edema with congestion, rhinorrhea, oropharynx  W/o edema or exudate  Eyes:    PERRL, conjunctiva/corneas clear, EOM's intact       Lungs:     Clear to auscultation bilaterally, respirations unlabored  Heart:    Regular rate and rhythm  Neurologic:   Awake, alert, oriented x 3. No apparent focal neurological           defect.      UC Treatments / Results  Labs (all labs ordered are listed, but only abnormal results are displayed) Labs Reviewed - No data to display  EKG   Radiology No results found.  Procedures Procedures (including critical care time)  Medications Ordered in UC Medications - No data to display  Initial Impression / Assessment and Plan / UC Course  I have reviewed the triage vital signs and the nursing notes.  Pertinent labs & imaging results that were available during my care of the patient were reviewed by me and considered in my medical decision making (see chart for details).     COVID/Flu test pending. Symptom management warranted only.  Manage fever with Tylenol and ibuprofen.  Nasal symptoms with over-the-counter antihistamines recommended.  Treatment per discharge medications/discharge instructions.  Red flags/ER precautions given. The most current CDC isolation/quarantine recommendation advised.   Final Clinical Impressions(s) / UC Diagnoses   Final diagnoses:  Viral illness  Suspected COVID-19 virus infection     Discharge Instructions     Your COVID-19 test will be available within 24 to 48 hours.  Our office will contact you if your COVID test is positive if your test is negative you will not hear from our office.  You can access your COVID-19 test results through MyChart.  MyChart can be set up using Access code located on your discharge paperwork.   You received a Toradol injection for body aches today.  Continue Tylenol as needed for body aches and headache. For cough I have sent over benzonatate Perles to take as directed and as needed for cough.  If you develop any shortness of breath or severe chest pain go immediately to the emergency department.    ED Prescriptions    Medication Sig Dispense Auth. Provider   benzonatate (TESSALON) 100 MG capsule Take 1-2 capsules (100-200 mg total) by mouth 3 (three) times daily as needed for cough. 40 capsule Bing Neighbors, FNP     PDMP not reviewed this encounter.   Bing Neighbors, FNP 11/23/20 [phone]

## 2020-11-17 NOTE — Discharge Instructions (Addendum)
Your COVID-19 test will be available within 24 to 48 hours.  Our office will contact you if your COVID test is positive if your test is negative you will not hear from our office.  You can access your COVID-19 test results through MyChart.  MyChart can be set up using Access code located on your discharge paperwork.   You received a Toradol injection for body aches today.  Continue Tylenol as needed for body aches and headache. For cough I have sent over benzonatate Perles to take as directed and as needed for cough.  If you develop any shortness of breath or severe chest pain go immediately to the emergency department.

## 2020-11-17 NOTE — ED Triage Notes (Signed)
Pt in with c/oo cough and body aches that has been going on for 3 days now   Pt has been taking tylenol for sxs with no relief

## 2021-01-08 NOTE — Progress Notes (Signed)
Reviewed and agree with documentation and assessment and plan. K. Veena Zoya Sprecher , MD   

## 2021-01-29 ENCOUNTER — Ambulatory Visit (HOSPITAL_COMMUNITY)
Admission: RE | Admit: 2021-01-29 | Discharge: 2021-01-29 | Disposition: A | Discharge status: Home / Self Care | Payer: Self-pay | Source: Ambulatory Visit | Attending: Emergency Medicine | Admitting: Emergency Medicine

## 2021-01-29 ENCOUNTER — Encounter (HOSPITAL_COMMUNITY): Payer: Self-pay

## 2021-01-29 VITALS — BP 127/82 | HR 75 | Temp 98.5°F | Resp 18

## 2021-01-29 DIAGNOSIS — M542 Cervicalgia: Secondary | ICD-10-CM

## 2021-01-29 DIAGNOSIS — R202 Paresthesia of skin: Secondary | ICD-10-CM

## 2021-01-29 DIAGNOSIS — M25511 Pain in right shoulder: Secondary | ICD-10-CM

## 2021-01-29 DIAGNOSIS — Z760 Encounter for issue of repeat prescription: Secondary | ICD-10-CM

## 2021-01-29 MED ORDER — TIZANIDINE HCL 2 MG PO TABS: 2.0000 mg | ORAL_TABLET | Freq: Every day | ORAL | 0 refills | Status: DC

## 2021-01-29 MED ORDER — ONDANSETRON HCL 4 MG PO TABS: 4.0000 mg | ORAL_TABLET | Freq: Four times a day (QID) | ORAL | 0 refills | Status: DC | PRN

## 2021-01-29 MED ORDER — OMEPRAZOLE 40 MG PO CPDR: 40.0000 mg | DELAYED_RELEASE_CAPSULE | Freq: Every day | ORAL | 0 refills | Status: DC

## 2021-01-29 MED ORDER — PREDNISONE 10 MG (21) PO TBPK: ORAL_TABLET | Freq: Every day | ORAL | 0 refills | Status: DC

## 2021-01-29 NOTE — ED Triage Notes (Signed)
Pt reports right shoulder pain and numbness in the fingers x 3 days.

## 2021-01-29 NOTE — ED Provider Notes (Signed)
[facility]    CSN: [id number] Arrival date & time: 01/29/21  0848      History   Chief Complaint Chief Complaint  Patient presents with  . Appointment    0900  . Numbness    HPI Alison Stanton is a 50 y.o. female.   Alison Stanton presents with complaints of bilateral hand/ finger numbness and tingling sensation which has been ongoing for a few weeks now. Right hand is worse than left. She feels right shoulder and lateral neck pain as well. No known injury or repetitive use. She is not diabetic. Tylenol had helped originally but more recently is not helping with her symptoms. Pain and symptoms are worse at night while sleeping, worse if she lays on her right shoulder. She also requests refills of her stomach medications which she is out of.    [place] video interpreter used to collect history and physical exam.    ROS per HPI, negative if not otherwise mentioned.      Past Medical History:  Diagnosis Date  . Chronic headaches 05/28/2017  . 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 Environmental and seasonal allergies 05/28/2017  . Gallstones     Patient Active Problem List   Diagnosis Date Noted  . 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 Chronic headaches 05/28/2017  . Environmental and seasonal allergies 05/28/2017    Past Surgical History:  Procedure Laterality Date  . CHOLECYSTECTOMY  04/2016   [place]aroscopic--[facility]  [place]    OB History   No obstetric history on file.      Home Medications    Prior to Admission medications   Medication Sig Start Date End Date Taking? Authorizing Provider  predniSONE (STERAPRED UNI-PAK 21 TAB) 10 MG (21) TBPK tablet Take by mouth daily. Per box instruction 01/29/21  Yes Toshiyuki Fredell, Dorene Grebe B, NP  tiZANidine (ZANAFLEX) 2 MG tablet Take 1 tablet (2 mg total) by mouth at bedtime. 01/29/21  Yes Giovonnie Trettel, Dorene Grebe B, NP  benzonatate (TESSALON) 100 MG capsule Take 1-2 capsules (100-200 mg total) by mouth 3 (three) times  daily as needed for cough. 11/17/20   Bing Neighbors, FNP  ibuprofen (ADVIL) 600 MG tablet Take 1 tablet (600 mg total) by mouth every 8 (eight) hours as needed for moderate pain. 08/04/20   Janace Aris, NP  omeprazole (PRILOSEC) 40 MG capsule Take 1 capsule (40 mg total) by mouth daily before breakfast. 01/29/21   Georgetta Haber, NP  ondansetron (ZOFRAN) 4 MG tablet Take 1 tablet (4 mg total) by mouth every 6 (six) hours as needed for nausea or vomiting. 01/29/21   Georgetta Haber, NP    Family History Family History  Problem Relation Age of Onset  . Hypertension Mother   . Headache Father   . Hypertension Father   . Diabetes Sister     Social History Social History   Tobacco Use  . Smoking status: Never Smoker  . Smokeless tobacco: Never Used  Vaping Use  . Vaping Use: Never used  Substance Use Topics  . Alcohol use: No  . Drug use: No     Allergies   Patient has no known allergies.   Review of Systems Review of Systems   Physical Exam Triage Vital Signs ED Triage Vitals  Enc Vitals Group     BP 01/29/21 0902 127/82     Pulse Rate 01/29/21 0902 75     Resp 01/29/21 0902 18     Temp 01/29/21 0902  98.5 F (36.9 C)     Temp Source 01/29/21 0902 Oral     SpO2 01/29/21 0902 97 %     Weight --      Height --      Head Circumference --      Peak Flow --      Pain Score 01/29/21 0900 0     Pain Loc --      Pain Edu? --      Excl. in GC? --    No data found.  Updated Vital Signs BP 127/82 (BP Location: Right Arm)   Pulse 75   Temp 98.5 F (36.9 C) (Oral)   Resp 18   SpO2 97%   Visual Acuity Right Eye Distance:   Left Eye Distance:   Bilateral Distance:    Right Eye Near:   Left Eye Near:    Bilateral Near:     Physical Exam Constitutional:      General: She is not in acute distress.    Appearance: She is well-developed.  Neck:     Comments: Right lateral neck musculature with tenderness on palpation without bony tenderness   Cardiovascular:     Rate and Rhythm: Normal rate.  Pulmonary:     Effort: Pulmonary effort is normal.  Musculoskeletal:     Right shoulder: Tenderness present. No swelling or bony tenderness. Normal range of motion. Normal strength. Normal pulse.     Left shoulder: Normal.     Right hand: Normal.     Left hand: Normal.     Cervical back: Full passive range of motion without pain. Muscular tenderness present. No pain with movement or spinous process tenderness.     Comments: Gross sensation intact to bilateral hands and fingers; negative tinel and phalen's sign; full ROM of shoulder and elbow with pain noted to deltoid and right anterior shoulder with abduction; strong radial pulses; cap refill < 2 seconds   Skin:    General: Skin is warm and dry.  Neurological:     Mental Status: She is alert and oriented to person, place, and time.      UC Treatments / Results  Labs (all labs ordered are listed, but only abnormal results are displayed) Labs Reviewed - No data to display  EKG   Radiology No results found.  Procedures Procedures (including critical care time)  Medications Ordered in UC Medications - No data to display  Initial Impression / Assessment and Plan / UC Course  I have reviewed the triage vital signs and the nursing notes.  Pertinent labs & imaging results that were available during my care of the patient were reviewed by me and considered in my medical decision making (see chart for details).     Suspect paraesthesias are stemming from muscle spasms at neck as well as right shoulder. Stomach history and with frequent vomiting, opted to avoid nsaids and try prednisone as well as tizanidine. Follow up recommendations provided. Patient verbalized understanding and agreeable to plan.   Final Clinical Impressions(s) / UC Diagnoses   Final diagnoses:  Neck pain  Pain in joint of right shoulder  Paresthesias in right hand  Medication refill     Discharge  Instructions     Light and regular activity with your shoulder and stretching of your neck.  Muscle relaxer at night to help with any neck tightness. May cause drowsiness. Please do not take if driving or drinking alcohol.   Prednisone dose pack to treat any inflammation which  may be source of the numbness sensation. Continue with tylenol as needed.  Please follow up with a primary care provider as you may need further evaluation and treatment if symptoms persist.   Activit lgre et rgulire avec votre paule et tirement de votre cou. Dcontractant musculaire la nuit pour aider  toute tension du cou. Peut provoquer somnolence. Veuillez ne pas prendre si vous conduisez ou buvez de l'alcool. Pack dose de prednisone pour traiter toute inflammation pouvant tre  l'origine de la sensation d'engourdissement. Continuer avec du tylenol au besoin. Veuillez faire un suivi auprs d'un fournisseur de soins primaires, car vous pourriez avoir besoin d'une valuation et d'un traitement supplmentaires si les symptmes persistent.   ED Prescriptions    Medication Sig Dispense Auth. Provider   omeprazole (PRILOSEC) 40 MG capsule Take 1 capsule (40 mg total) by mouth daily before breakfast. 30 capsule Tiernan Suto B, NP   ondansetron (ZOFRAN) 4 MG tablet Take 1 tablet (4 mg total) by mouth every 6 (six) hours as needed for nausea or vomiting. 20 tablet Linus Mako B, NP   tiZANidine (ZANAFLEX) 2 MG tablet Take 1 tablet (2 mg total) by mouth at bedtime. 20 tablet Linus Mako B, NP   predniSONE (STERAPRED UNI-PAK 21 TAB) 10 MG (21) TBPK tablet Take by mouth daily. Per box instruction 21 tablet Georgetta Haber, NP     PDMP not reviewed this encounter.   Georgetta Haber, NP 01/29/21 [phone]

## 2021-01-29 NOTE — Discharge Instructions (Signed)
Light and regular activity with your shoulder and stretching of your neck.  Muscle relaxer at night to help with any neck tightness. May cause drowsiness. Please do not take if driving or drinking alcohol.   Prednisone dose pack to treat any inflammation which may be source of the numbness sensation. Continue with tylenol as needed.  Please follow up with a primary care provider as you may need further evaluation and treatment if symptoms persist.   Activit lgre et rgulire avec votre paule et tirement de votre cou. Dcontractant musculaire la nuit pour aider  toute tension du cou. Peut provoquer somnolence. Veuillez ne pas prendre si vous conduisez ou buvez de l'alcool. Pack dose de prednisone pour traiter toute inflammation pouvant tre  l'origine de la sensation d'engourdissement. Continuer avec du tylenol au besoin. Veuillez faire un suivi auprs d'un fournisseur de soins primaires, car vous pourriez avoir besoin d'une valuation et d'un traitement supplmentaires si les symptmes persistent.

## 2021-03-27 ENCOUNTER — Other Ambulatory Visit: Payer: Self-pay

## 2021-03-27 ENCOUNTER — Ambulatory Visit (HOSPITAL_COMMUNITY)
Admission: EM | Admit: 2021-03-27 | Discharge: 2021-03-27 | Disposition: A | Discharge status: Home / Self Care | Payer: Self-pay

## 2021-03-27 ENCOUNTER — Encounter (HOSPITAL_COMMUNITY): Payer: Self-pay

## 2021-03-27 DIAGNOSIS — M79604 Pain in right leg: Secondary | ICD-10-CM

## 2021-03-27 DIAGNOSIS — M79605 Pain in left leg: Secondary | ICD-10-CM

## 2021-03-27 DIAGNOSIS — R110 Nausea: Secondary | ICD-10-CM

## 2021-03-27 DIAGNOSIS — R1084 Generalized abdominal pain: Secondary | ICD-10-CM

## 2021-03-27 DIAGNOSIS — K921 Melena: Secondary | ICD-10-CM

## 2021-03-27 DIAGNOSIS — R1031 Right lower quadrant pain: Secondary | ICD-10-CM

## 2021-03-27 LAB — POCT URINALYSIS DIPSTICK, ED / UC
Bilirubin Urine: NEGATIVE
Glucose, UA: NEGATIVE mg/dL
Hgb urine dipstick: NEGATIVE
Ketones, ur: 15 mg/dL — AB
Leukocytes,Ua: NEGATIVE
Nitrite: NEGATIVE
Protein, ur: NEGATIVE mg/dL
Specific Gravity, Urine: 1.025 (ref 1.005–1.030)
Urobilinogen, UA: 0.2 mg/dL (ref 0.0–1.0)
pH: 5.5 (ref 5.0–8.0)

## 2021-03-27 NOTE — ED Provider Notes (Signed)
[facility]    CSN: [id number] Arrival date & time: 03/27/21  1153      History   Chief Complaint Chief Complaint  Patient presents with  . Abdominal Pain    HPI Alison Stanton is a 50 y.o. female.   50 year old female presents with generalized abdominal pain over the past 2 weeks. Pain is worse on the right side compared to left and pain increases when lying down. Pain level 10/10. Pain radiates down both legs and unable to sleep due to the pain. Pain will improve briefly when sitting or standing but then increases again. Has felt nauseous and occasional vomiting over the past week. Also has seen blood in her stool for 4 to 5 days last week. Had normal bowel movement this morning. No distinct pain when urinating. No blood in her urine. No unusual vaginal discharge. Recent period 2 weeks ago. Has felt warm but no documented fever. No sore throat, difficulty swallowing or chest pain. Decreased appetite and ate rice today with no change in pain. Has taken Ibuprofen and Tylenol with no relief. Has history of abdominal pain and possible GERD in the past and has been evaluated by [facility] in Nov and Dec 2021 and had CT scan of abdomen which was negative at the time. Recommend screening colonoscopy but patient wanted to wait before having procedure since she did not have health insurance. Was placed on Prilosec daily but finished a few days ago. Pain started while still on medication and has not changed since stopping meds. This pain seems different that previous abdominal pain. Also has history of gallstones and surgical hx includes cholecystectomy. No tobacco, alcohol or illicit drug use. Other chronic health issues include headaches and environmental allergies. No daily medications currently.   The history is provided by the patient. The history is limited by a language barrier. A language interpreter was used [place] used).  Had some difficulty with language  barrier and obtaining accurate history- [place] interpreter indicated that patient would contradict herself.   Past Medical History:  Diagnosis Date  . Chronic headaches 05/28/2017  . 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 Environmental and seasonal allergies 05/28/2017  . Gallstones     Patient Active Problem List   Diagnosis Date Noted  . 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 Chronic headaches 05/28/2017  . Environmental and seasonal allergies 05/28/2017    Past Surgical History:  Procedure Laterality Date  . CHOLECYSTECTOMY  04/2016   [place]  [place]    OB History   No obstetric history on file.      Home Medications    Prior to Admission medications   Medication Sig Start Date End Date Taking? Authorizing Provider  acetaminophen (TYLENOL) 500 MG tablet Take 500 mg by mouth every 6 (six) hours as needed.   Yes [provider]  ibuprofen (ADVIL) 600 MG tablet Take 1 tablet (600 mg total) by mouth every 8 (eight) hours as needed for moderate pain. 08/04/20   Janace Aris, NP  omeprazole (PRILOSEC) 40 MG capsule Take 1 capsule (40 mg total) by mouth daily before breakfast. 01/29/21   Georgetta Haber, NP  ondansetron (ZOFRAN) 4 MG tablet Take 1 tablet (4 mg total) by mouth every 6 (six) hours as needed for nausea or vomiting. 01/29/21   Georgetta Haber, NP    Family History Family History  Problem Relation Age of Onset  . Hypertension Mother   . Headache Father   .  Hypertension Father   . Diabetes Sister     Social History Social History   Tobacco Use  . Smoking status: Never Smoker  . Smokeless tobacco: Never Used  Vaping Use  . Vaping Use: Never used  Substance Use Topics  . Alcohol use: No  . Drug use: No     Allergies   Patient has no known allergies.   Review of Systems Review of Systems  Constitutional: Positive for activity change, appetite change, chills and fatigue. Negative for diaphoresis and fever (but felt warm).   HENT: Negative for congestion, mouth sores, sore throat and trouble swallowing.   Respiratory: Negative for cough, chest tightness and shortness of breath.   Cardiovascular: Negative for chest pain.  Gastrointestinal: Positive for abdominal pain, blood in stool, nausea and vomiting. Negative for diarrhea.  Genitourinary: Positive for flank pain and pelvic pain. Negative for decreased urine volume, difficulty urinating, dysuria, frequency, hematuria, urgency, vaginal bleeding, vaginal discharge and vaginal pain.  Musculoskeletal: Positive for back pain and myalgias.  Skin: Negative for color change and rash.  Allergic/Immunologic: Positive for environmental allergies. Negative for food allergies and immunocompromised state.  Neurological: Negative for dizziness, seizures, syncope, light-headedness, numbness and headaches.  Hematological: Negative for adenopathy. Does not bruise/bleed easily.     Physical Exam Triage Vital Signs ED Triage Vitals  Enc Vitals Group     BP 03/27/21 1306 111/73     Pulse Rate 03/27/21 1306 81     Resp 03/27/21 1306 18     Temp 03/27/21 1306 97.9 F (36.6 C)     Temp Source 03/27/21 1306 Oral     SpO2 03/27/21 1306 100 %     Weight --      Height --      Head Circumference --      Peak Flow --      Pain Score 03/27/21 1312 10     Pain Loc --      Pain Edu? --      Excl. in GC? --    No data found.  Updated Vital Signs BP 111/73 (BP Location: Right Arm)   Pulse 81   Temp 97.9 F (36.6 C) (Oral)   Resp 18   LMP  (Within Weeks) Comment: 2 weeks  SpO2 100%   Visual Acuity Right Eye Distance:   Left Eye Distance:   Bilateral Distance:    Right Eye Near:   Left Eye Near:    Bilateral Near:     Physical Exam Vitals and nursing note reviewed.  Constitutional:      General: She is awake. She is not in acute distress.    Appearance: She is well-developed. She is ill-appearing.       Comments: She is sitting in the exam chair in no acute  distress but appears uncomfortable due to pain and continues to rub her upper thighs and holds her right side due to pain.   HENT:     Head: Normocephalic and atraumatic.     Right Ear: Hearing normal.     Left Ear: Hearing normal.  Eyes:     Extraocular Movements: Extraocular movements intact.     Conjunctiva/sclera: Conjunctivae normal.  Cardiovascular:     Rate and Rhythm: Normal rate and regular rhythm.     Heart sounds: Normal heart sounds. No murmur heard.   Pulmonary:     Effort: Pulmonary effort is normal. No respiratory distress.     Breath sounds: Normal breath sounds and air  entry. No decreased air movement. No decreased breath sounds, wheezing, rhonchi or rales.  Abdominal:     General: Abdomen is flat. Bowel sounds are normal.     Palpations: Abdomen is soft. There is no hepatomegaly or splenomegaly.     Tenderness: There is generalized abdominal tenderness and tenderness in the right upper quadrant and right lower quadrant. There is right CVA tenderness and rebound. There is no left CVA tenderness. Positive signs include psoas sign.     Comments: Right upper and lower quadrant more tender than left. Also more right CVA tenderness. No distinct masses. No distinct hernia or lymph node swelling.   Genitourinary:    Comments: Patient declined pelvic exam at this time.  Musculoskeletal:        General: No swelling or tenderness. Normal range of motion.     Cervical back: Normal range of motion.     Right lower leg: Normal. No edema.     Left lower leg: Normal. No edema.     Comments: Normal range of motion of both legs. Increase in right upper and lower abdominal pain with right knee flexion. No distinct swelling or edema. No redness. No numbness or neuro deficits noted. Good distal pulses.   Skin:    General: Skin is warm and dry.     Capillary Refill: Capillary refill takes less than 2 seconds.     Findings: No bruising, erythema or rash.  Neurological:     General: No  focal deficit present.     Mental Status: She is alert and oriented to person, place, and time.     Sensory: Sensation is intact. No sensory deficit.     Motor: Motor function is intact.     Gait: Gait is intact.  Psychiatric:        Mood and Affect: Mood normal.        Behavior: Behavior normal. Behavior is cooperative.        Thought Content: Thought content normal.      UC Treatments / Results  Labs (all labs ordered are listed, but only abnormal results are displayed) Labs Reviewed  POCT URINALYSIS DIPSTICK, ED / UC - Abnormal; Notable for the following components:      Result Value   Ketones, ur 15 (*)    All other components within normal limits    EKG   Radiology No results found.  Procedures Procedures (including critical care time)  Medications Ordered in UC Medications - No data to display  Initial Impression / Assessment and Plan / UC Course  I have reviewed the triage vital signs and the nursing notes.  Pertinent labs & imaging results that were available during my care of the patient were reviewed by me and considered in my medical decision making (see chart for details).    Due to technological issues with Video Interpreter system- had to wait 45 minutes before being able to discuss urine results and recommendations with patient. Then patient requested husband come into exam room and we reviewed all information discussed again with him as well.  Reviewed urinalysis results with patient- slight positive ketones but otherwise negative. No distinct UTI or renal stone. Patient is stable, afebrile and not in distress. Uncertain of etiology of pain- need further evaluation to rule out appendicitis, mass or other concerning GI disorder.  Due to continued severe pain with nausea, history of recent blood in stool and radiation of pain to extremities- recommend further evaluation now at the ER. Husband asked  for pain medication for his wife but discussed that due to  uncertainty of etiology causing pain, we would not prescribe any pain medication yet and have limited choices for pain control in Urgent Care. Reviewed that she needs further work up and evaluation. Husband worried about being denied care at ER due to no health insurance. Discussed that the ER can not turn a patient away due to lack of insurance. Patient and husband understand and agree and will go by personal vehicle to [facility] ER now for further evaluation.  Final Clinical Impressions(s) / UC Diagnoses   Final diagnoses:  Generalized abdominal pain  Abdominal pain, right lower quadrant  Nausea  Bilateral leg pain  Blood in the stool     Discharge Instructions     Your urine was negative for infection. No blood seen. Uncertain what is causing your abdominal and leg pain. Since you are in significant pain and it is getting worse, recommend go to the ER now for further evaluation.     ED Prescriptions    None     PDMP not reviewed this encounter.   Sudie Grumbling, NP 03/27/21 1955

## 2021-03-27 NOTE — ED Triage Notes (Signed)
Pt reports abdominal pain, bilateral leg pain x 2 weeks. Leg pan is worse when lay down. States no able to sleep due to pain. Tylenol and ibuprofen gives no relief.

## 2021-03-27 NOTE — Discharge Instructions (Addendum)
Your urine was negative for infection. No blood seen. Uncertain what is causing your abdominal and leg pain. Since you are in significant pain and it is getting worse, recommend go to the ER now for further evaluation.

## 2021-09-19 IMAGING — CT CT HEAD W/O CM
4 series · 16 of 47 positions shown, 18 images · non-contrast
Comparison: None.

CLINICAL DATA: Worsening of chronic headaches

EXAM:
CT HEAD WITHOUT CONTRAST
TECHNIQUE: Contiguous axial images were obtained from the base of the skull
through the vertex without intravenous contrast.

[Series 3: head without · axial · non-contrast · 0.42mm/px · z∈[-75,+45]mm · 7 of 32 slices shown, 9 images]
[im 4/32  brain]
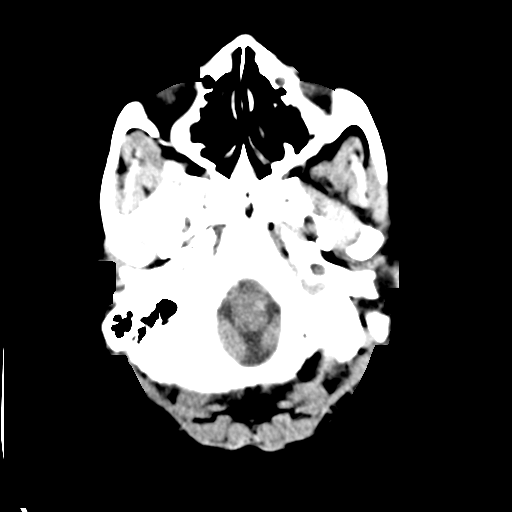
[im 4/32  bone]
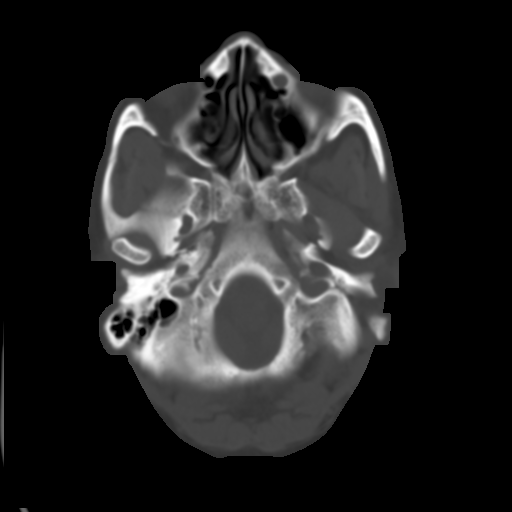
[im 8/32  brain]
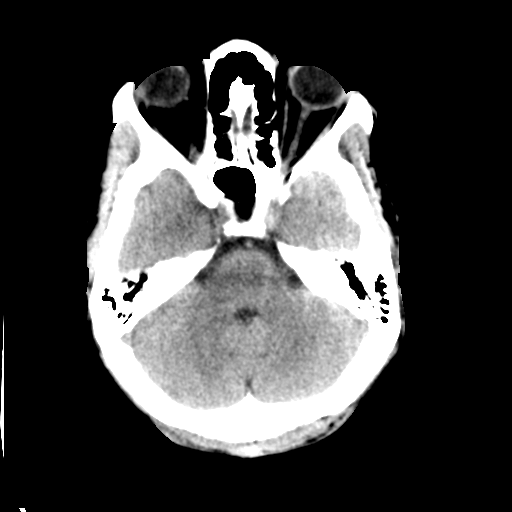
[im 12/32  brain]
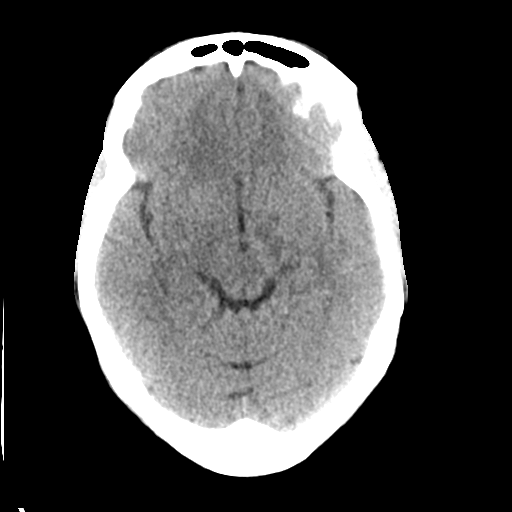
[im 16/32  brain]
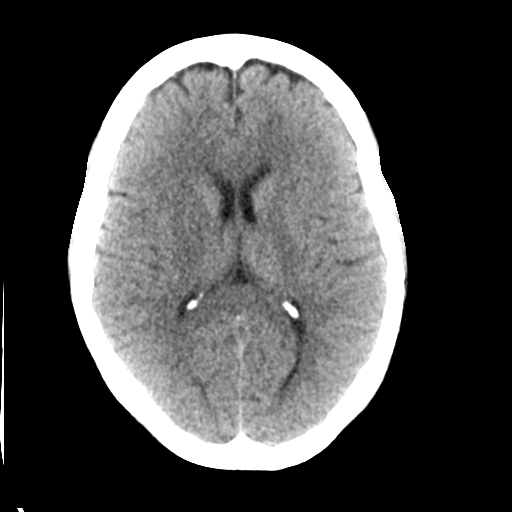
[im 20/32  brain]
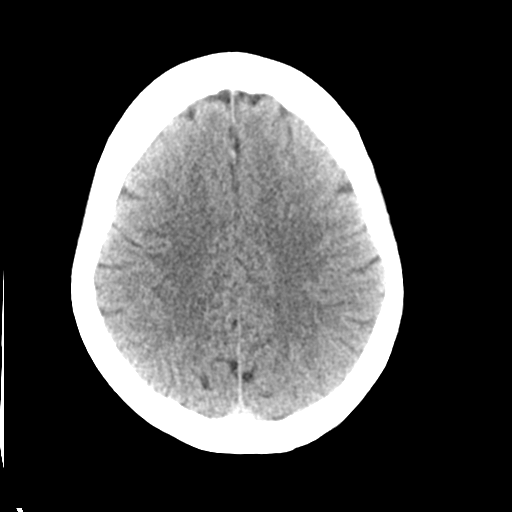
[im 20/32  bone]
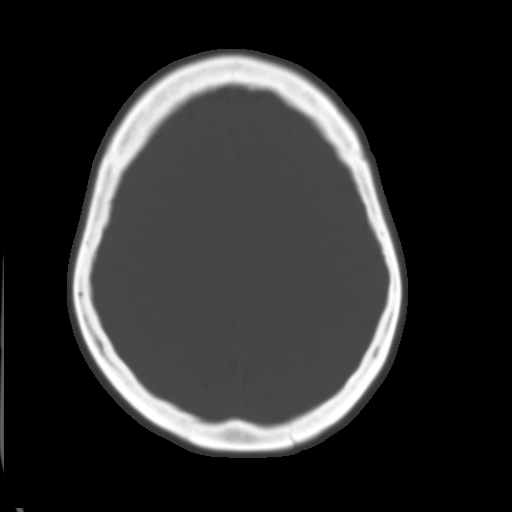
[im 24/32  brain]
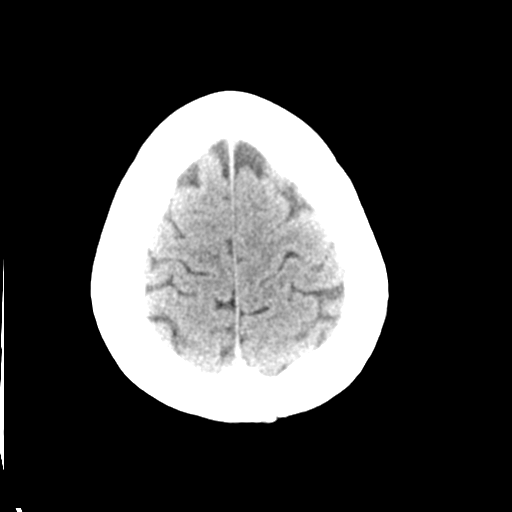
[im 28/32  brain]
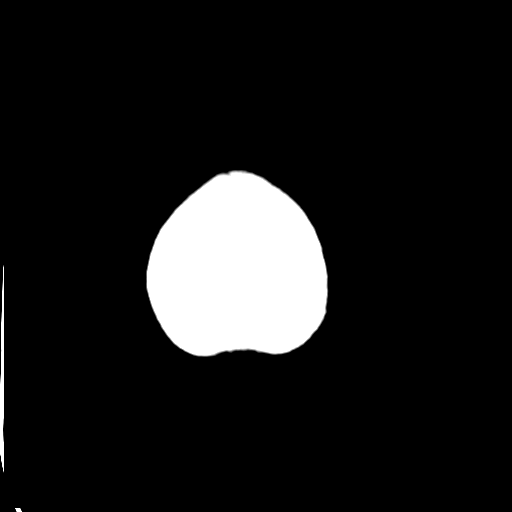

[Series 4: head bone · axial · 0.42mm/px · z∈[-76,-44]mm · 3 of 79 slices shown]
[im 8/79  bone]
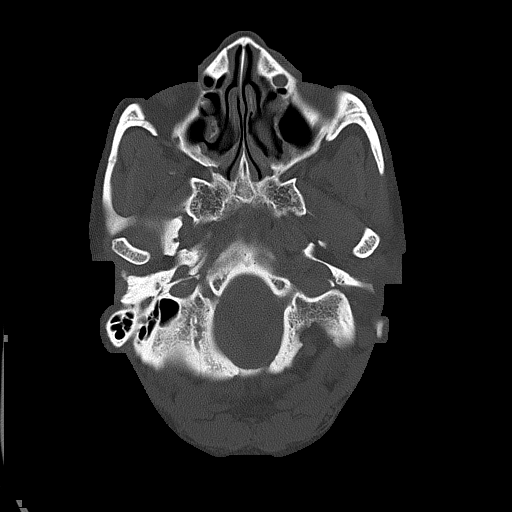
[im 16/79  bone]
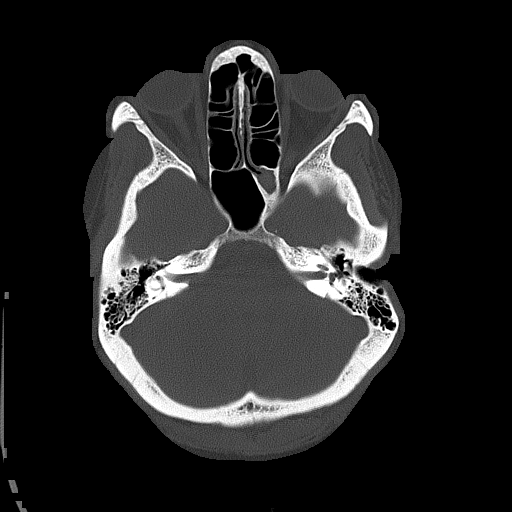
[im 24/79  bone]
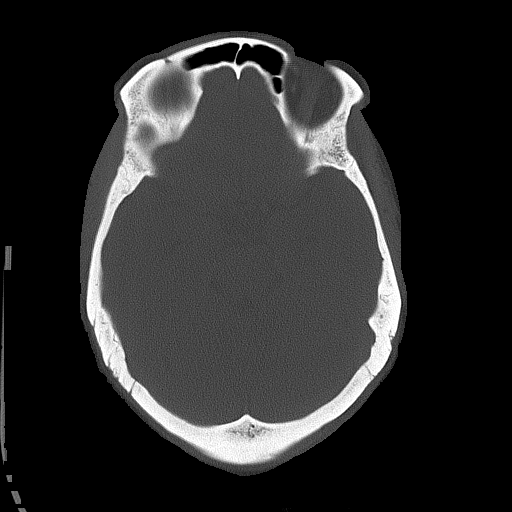

[Series 5: head without cor · coronal · non-contrast · 0.30mm/px · 3 of 67 slices shown]
[im 23/67  brain]
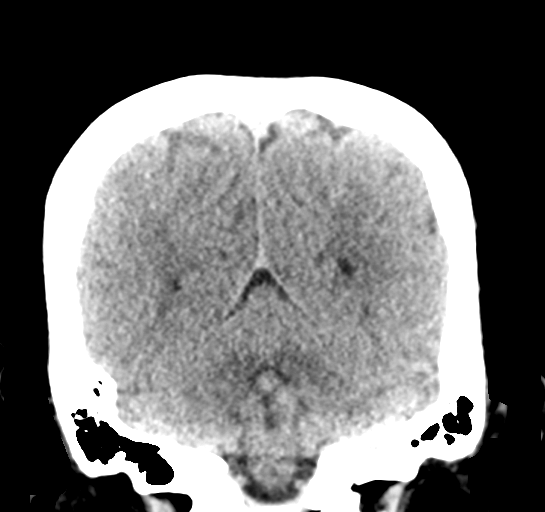
[im 30/67  brain]
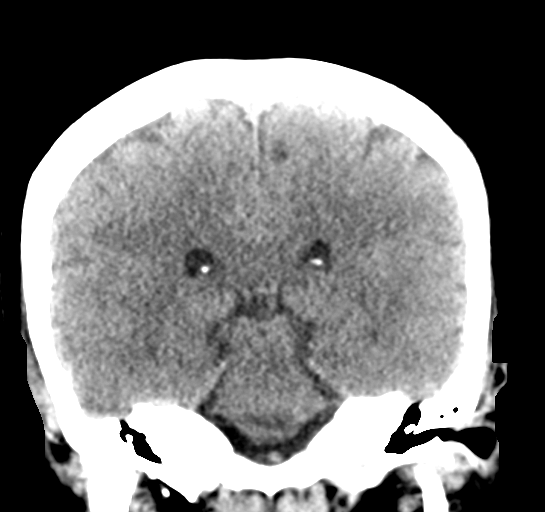
[im 37/67  brain]
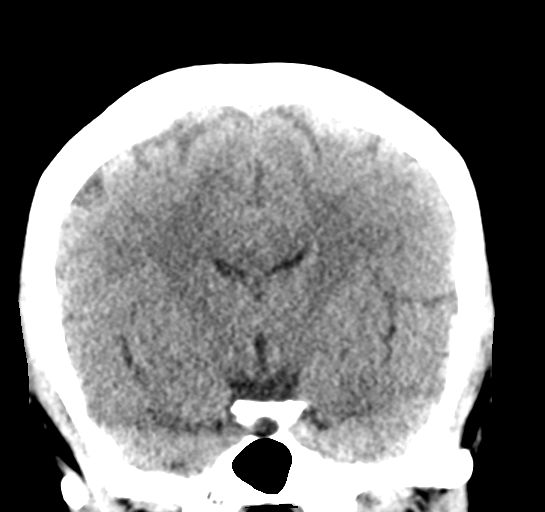

[Series 6: head without sag · sagittal · non-contrast · 0.31mm/px · 3 of 54 slices shown]
[im 18/54  brain]
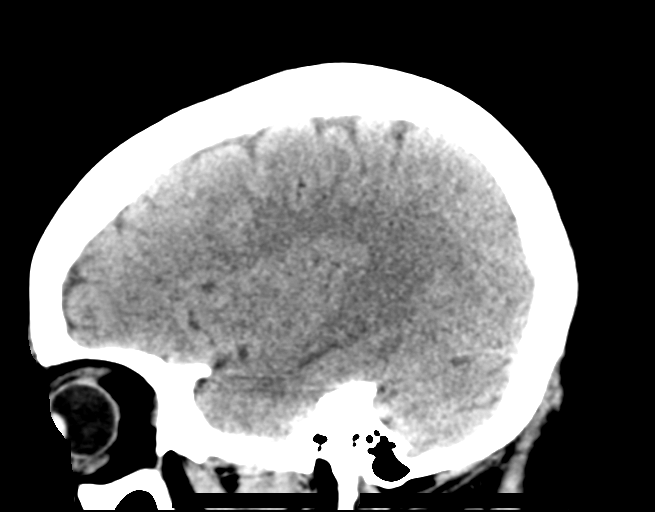
[im 27/54  brain]
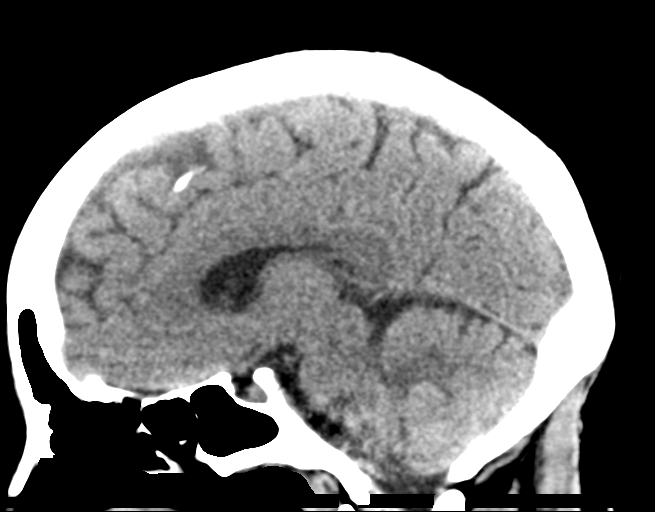
[im 36/54  brain]
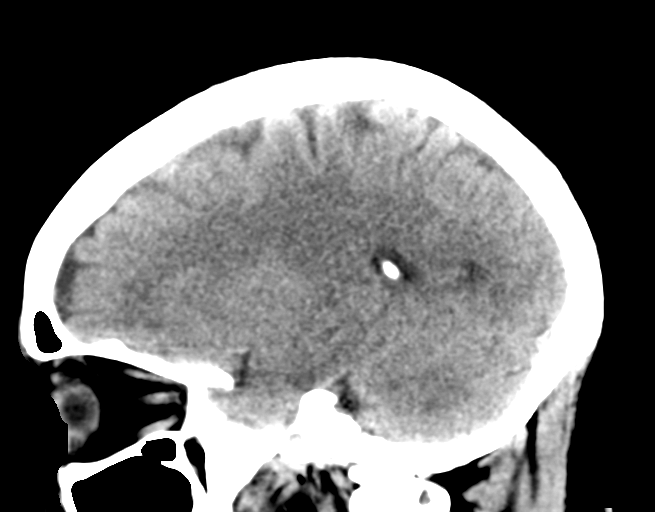

[16 of 47 positions shown; findings below may reference images not displayed]

FINDINGS: Brain: No acute infarct or hemorrhage. Lateral ventricles and
midline structures appear unremarkable. No acute extra-axial fluid
collections. No mass effect.

Vascular: No hyperdense vessel or unexpected calcification.

Skull: Normal. Negative for fracture or focal lesion.

Sinuses/Orbits: There is mucoperiosteal thickening within the left
sphenoid sinus and bilateral maxillary sinuses. Remaining paranasal
sinuses and mastoid air cells are unremarkable.

Other: None
IMPRESSION: 1. Mild left sphenoid and bilateral maxillary sinus disease.
2. Otherwise no acute intracranial process.

## 2021-12-18 ENCOUNTER — Other Ambulatory Visit: Payer: Self-pay

## 2021-12-18 ENCOUNTER — Ambulatory Visit (HOSPITAL_COMMUNITY): Admission: EM | Admit: 2021-12-18 | Discharge: 2021-12-18 | Discharge status: Against Medical Advice | Payer: Self-pay

## 2022-02-11 ENCOUNTER — Ambulatory Visit: Payer: Self-pay

## 2022-02-11 NOTE — Telephone Encounter (Signed)
? ? ?  Chief Complaint: Right shoulder pain that goes down to her right foot. ?Symptoms: Pain ?Frequency: 5 days ago ?Pertinent Negatives: Patient denies weakness ?Disposition: [] ED /[] Urgent Care (no appt availability in office) / [] Appointment(In office/virtual)/ []  [facility]/ [] Home Care/ [] Refused Recommended Disposition /[]  Mobile Bus/ []  Follow-up with PCP ?Additional Notes: Pt. Asking to be worked in. Please advise.  ?Answer Assessment - Initial Assessment Questions ?1. ONSET: "When did the pain start?" ?    5 days ?2. LOCATION: "Where is the pain located?" ?    Right shoulder ?3. PAIN: "How bad is the pain?" (Scale 1-10; or mild, moderate, severe) ?  - MILD (1-3): doesn't interfere with normal activities ?  - MODERATE (4-7): interferes with normal activities (e.g., work or school) or awakens from sleep ?  - SEVERE (8-10): excruciating pain, unable to do any normal activities, unable to move arm at all due to pain ?    Now - 9 ?4. WORK OR EXERCISE: "Has there been any recent work or exercise that involved this part of the body?" ?    No ?5. CAUSE: "What do you think is causing the shoulder pain?" ?    Unsure ?6. OTHER SYMPTOMS: "Do you have any other symptoms?" (e.g., neck pain, swelling, rash, fever, numbness, weakness) ?    Constant,  ?7. PREGNANCY: "Is there any chance you are pregnant?" "When was your last menstrual period?" ?    No ? ?Protocols used: Shoulder Pain-A-AH ? ?

## 2022-02-11 NOTE — Telephone Encounter (Signed)
Scheduled apt for 02/12/2022 at 1600 with Provider [place]. Address given to PCE. Verbalized understanding.  ?

## 2022-02-12 ENCOUNTER — Ambulatory Visit (INDEPENDENT_AMBULATORY_CARE_PROVIDER_SITE_OTHER): Payer: Self-pay | Admitting: Physician Assistant

## 2022-02-12 ENCOUNTER — Encounter: Payer: Self-pay | Admitting: Physician Assistant

## 2022-02-12 VITALS — BP 119/83 | HR 73 | Temp 98.3°F | Resp 18 | Ht 64.0 in | Wt 185.0 lb

## 2022-02-12 DIAGNOSIS — M5441 Lumbago with sciatica, right side: Secondary | ICD-10-CM

## 2022-02-12 MED ORDER — KETOROLAC TROMETHAMINE 60 MG/2ML IM SOLN
60.0000 mg | Freq: Once | INTRAMUSCULAR | Status: AC
Start: 2022-02-12 — End: 2022-02-12
  Administered 2022-02-12: 60 mg via INTRAMUSCULAR

## 2022-02-12 MED ORDER — METHYLPREDNISOLONE SODIUM SUCC 40 MG IJ SOLR
120.0000 mg | Freq: Once | INTRAMUSCULAR | Status: AC
  Administered 2022-02-12: 120 mg via INTRAMUSCULAR

## 2022-02-12 MED ORDER — CYCLOBENZAPRINE HCL 10 MG PO TABS: 10.0000 mg | ORAL_TABLET | Freq: Three times a day (TID) | ORAL | 0 refills | Status: DC | PRN

## 2022-02-12 NOTE — Progress Notes (Signed)
Patient has not eaten or taken medication today. ?Patient reports pain on the R side for the past 6 days. ?Patient reports pain radiates from the hip down to the foot, pain also radiates from hip up to shoulder. ?Patient shares tylenol and ibuprofen does provide relief. ? ?

## 2022-02-12 NOTE — Progress Notes (Signed)
? ?Established Patient Office Visit ? ?Subjective:  ?Patient ID: Alison Stanton, female    DOB: 11/28/1970  Age: 51 y.o. MRN: [id number] ? ?CC:  ?Chief Complaint  ?Patient presents with  ? Shoulder Pain  ? ? ?HPI ?Alison Stanton states that she started having pain in her right lower back / hip area about 6 days ago, denies injury  or trauma.  States that the pain will radiate up to her right shoulder and down to her right foot. ? ?Denies changes in BM; numbness or tingling; denies dysuria ? ?States that she has been taking  ibuprofen 800 and tylenol without relief. ? ?Due to language barrier, an interpreter was present during the history-taking and subsequent discussion (and for part of the physical exam) with this patient. ? ? ?Past Medical History:  ?Diagnosis Date  ? Chronic headaches 05/28/2017  ? Decreased visual acuity 05/28/2017  ? Environmental and seasonal allergies 05/28/2017  ? Gallstones   ? ? ?Past Surgical History:  ?Procedure Laterality Date  ? CHOLECYSTECTOMY  04/2016  ? [facility]  [place]  ? ? ?Family History  ?Problem Relation Age of Onset  ? Hypertension Mother   ? Headache Father   ? Hypertension Father   ? Diabetes Sister   ? ? ?Social History  ? ?Socioeconomic History  ? Marital status: Married  ?  Spouse name: Not on file  ? Number of children: 2  ? Years of education: 15  ? Highest education level: Not on file  ?Occupational History  ? Occupation: unemployed  ?Tobacco Use  ? Smoking status: Never  ? Smokeless tobacco: Never  ?Vaping Use  ? Vaping Use: Never used  ?Substance and Sexual Activity  ? Alcohol use: No  ? Drug use: No  ? Sexual activity: Yes  ?Other Topics Concern  ? Not on file  ?Social History Narrative  ? Originally from [place]  ? [place]  Had her surgery, then left to go back to [place].   ? [place] and here since.  ? When came back in November, came to [place] instead of [place] where she was at before.  ? Has  2 adopted children.  ? Lives with female friend of family.  Not a couple   ? Children, ages 71 and 22 yo live in [place]--she is hoping they will join her soon.   ? ?Social Determinants of Health  ? ?Financial Resource Strain: Not on file  ?Food Insecurity: Not on file  ?Transportation Needs: Not on file  ?Physical Activity: Not on file  ?Stress: Not on file  ?Social Connections: Not on file  ?Intimate Partner Violence: Not on file  ? ? ?Outpatient Medications Prior to Visit  ?Medication Sig Dispense Refill  ? acetaminophen (TYLENOL) 500 MG tablet Take 500 mg by mouth every 6 (six) hours as needed.    ? ibuprofen (ADVIL) 600 MG tablet Take 1 tablet (600 mg total) by mouth every 8 (eight) hours as needed for moderate pain. 30 tablet 0  ? omeprazole (PRILOSEC) 40 MG capsule Take 1 capsule (40 mg total) by mouth daily before breakfast. 30 capsule 0  ? ondansetron (ZOFRAN) 4 MG tablet Take 1 tablet (4 mg total) by mouth every 6 (six) hours as needed for nausea or vomiting. 20 tablet 0  ? ?No facility-administered medications prior to visit.  ? ? ?No Known Allergies ? ?ROS ?Review of Systems  ?Constitutional: Negative.   ?HENT: Negative.    ?  Eyes: Negative.   ?Respiratory:  Negative for shortness of breath.   ?Cardiovascular:  Negative for chest pain.  ?Gastrointestinal: Negative.   ?Endocrine: Negative.   ?Genitourinary:  Negative for dysuria and frequency.  ?Musculoskeletal:  Positive for arthralgias, back pain and myalgias. Negative for joint swelling.  ?Skin: Negative.   ?Allergic/Immunologic: Negative.   ?Neurological: Negative.   ?Hematological: Negative.   ?Psychiatric/Behavioral: Negative.    ? ?  ?Objective:  ?  ?Physical Exam ?Vitals and nursing note reviewed.  ?Constitutional:   ?   Appearance: Normal appearance.  ?HENT:  ?   Head: Normocephalic and atraumatic.  ?   Right Ear: External ear normal.  ?   Left Ear: External ear normal.  ?   Nose: Nose normal.  ?   Mouth/Throat:  ?   Mouth: Mucous membranes are  moist.  ?   Pharynx: Oropharynx is clear.  ?Eyes:  ?   Extraocular Movements: Extraocular movements intact.  ?   Conjunctiva/sclera: Conjunctivae normal.  ?   Pupils: Pupils are equal, round, and reactive to light.  ?Cardiovascular:  ?   Rate and Rhythm: Normal rate and regular rhythm.  ?   Pulses: Normal pulses.  ?   Heart sounds: Normal heart sounds.  ?Pulmonary:  ?   Effort: Pulmonary effort is normal.  ?   Breath sounds: Normal breath sounds.  ?Musculoskeletal:  ?   Cervical back: Normal, normal range of motion and neck supple.  ?   Thoracic back: Normal. No swelling.  ?   Lumbar back: Tenderness present. No swelling. Decreased range of motion.  ?   Comments: Pain elicited with range of motion testing  ?Skin: ?   General: Skin is warm and dry.  ?Neurological:  ?   General: No focal deficit present.  ?   Mental Status: She is alert and oriented to person, place, and time.  ?Psychiatric:     ?   Mood and Affect: Mood normal.     ?   Behavior: Behavior normal.     ?   Thought Content: Thought content normal.     ?   Judgment: Judgment normal.  ? ? ?BP 119/83 (BP Location: Left Arm, Patient Position: Sitting, Cuff Size: Large)   Pulse 73   Temp 98.3 ?F (36.8 ?C) (Oral)   Resp 18   Ht 5\' 4"  (1.626 m)   Wt 185 lb (83.9 kg)   LMP 09/25/2021   SpO2 95%   BMI 31.76 kg/m?  ?Wt Readings from Last 3 Encounters:  ?02/12/22 185 lb (83.9 kg)  ?10/26/20 188 lb 6.4 oz (85.5 kg)  ?09/25/20 188 lb 2 oz (85.3 kg)  ? ? ? ?Health Maintenance Due  ?Topic Date Due  ? COVID-19 Vaccine (1) Never done  ? HIV Screening  Never done  ? Hepatitis C Screening  Never done  ? TETANUS/TDAP  Never done  ? PAP SMEAR-Modifier  Never done  ? COLONOSCOPY (Pts 45-67yrs Insurance coverage will need to be confirmed)  Never done  ? MAMMOGRAM  Never done  ? Zoster Vaccines- Shingrix (1 of 2) Never done  ? ? ?There are no preventive care reminders to display for this patient. ? ?Lab Results  ?Component Value Date  ? TSH 1.170 09/22/2018  ? ?Lab  Results  ?Component Value Date  ? WBC 5.2 08/24/2020  ? HGB 13.2 08/24/2020  ? HCT 41.5 08/24/2020  ? MCV 88.5 08/24/2020  ? PLT 218 08/24/2020  ? ?Lab Results  ?Component Value  Date  ? NA 138 09/25/2020  ? K 3.9 09/25/2020  ? CO2 30 09/25/2020  ? GLUCOSE 83 09/25/2020  ? BUN 10 09/25/2020  ? CREATININE 0.66 09/25/2020  ? BILITOT 0.7 08/24/2020  ? ALKPHOS 64 08/24/2020  ? AST 19 08/24/2020  ? ALT 19 08/24/2020  ? PROT 7.7 08/24/2020  ? ALBUMIN 4.0 08/24/2020  ? CALCIUM 9.1 09/25/2020  ? ANIONGAP 10 08/24/2020  ? GFR 101.47 09/25/2020  ? ?No results found for: CHOL ?No results found for: HDL ?No results found for: LDLCALC ?No results found for: TRIG ?No results found for: CHOLHDL ?No results found for: HGBA1C ? ?  ?Assessment & Plan:  ? ?Problem List Items Addressed This Visit   ?None ?Visit Diagnoses   ? ? Acute right-sided low back pain with right-sided sciatica    -  Primary  ? Relevant Medications  ? cyclobenzaprine (FLEXERIL) 10 MG tablet  ? ketorolac (TORADOL) injection 60 mg (Completed)  ? methylPREDNISolone sodium succinate (SOLU-MEDROL) 40 mg/mL injection 120 mg (Completed)  ? ?  ? ? ?Meds ordered this encounter  ?Medications  ? cyclobenzaprine (FLEXERIL) 10 MG tablet  ?  Sig: Take 1 tablet (10 mg total) by mouth 3 (three) times daily as needed for muscle spasms.  ?  Dispense:  30 tablet  ?  Refill:  0  ?  Order Specific Question:   Supervising Provider  ?  Answer:   Storm FriskWRIGHT, PATRICK E [[id number]]  ? ketorolac (TORADOL) injection 60 mg  ? methylPREDNISolone sodium succinate (SOLU-MEDROL) 40 mg/mL injection 120 mg  ?1. Acute right-sided low back pain with right-sided sciatica ?Trial Flexeril, continue over-the-counter pain medications as needed.  Patient education given on supportive care, red flags for prompt reevaluation. ?- cyclobenzaprine (FLEXERIL) 10 MG tablet; Take 1 tablet (10 mg total) by mouth 3 (three) times daily as needed for muscle spasms.  Dispense: 30 tablet; Refill: 0 ?- ketorolac (TORADOL)  injection 60 mg ?- methylPREDNISolone sodium succinate (SOLU-MEDROL) 40 mg/mL injection 120 mg ? ? ?I have reviewed the patient's medical history (PMH, PSH, Social History, Family History, Medications, and allergies) , and h

## 2022-02-12 NOTE — Patient Instructions (Signed)
Sciatique ?Sciatica ?La sciatique est Lyondell Chemicalune douleur, un engourdissement, une faiblesse ou des picotements ressentis le long du nerf sciatique. Le nerf sciatique commence Goodrich Corporationdans le bas du dos et descend ? l'arri?re de Theatre stage managerchaque jambe. Le nerf contr?le les muscles de la partie inf?rieure de la jambe et ceux de l'arri?re du genou. Il assure ?galement la sensibilit? ? l'arri?re de la cuisse, la partie inf?rieure de la jambe et la Avery Dennisonplante du pied. La sciatique est le sympt?me d'une affection dans laquelle le nerf sciatique est pinc? ou subit une pression. ?Le plus souvent, la sciatique n'affecte qu'un c?t? Research scientist (medical)du corps. Une sciatique dispara?t g?n?ralement d'elle-m?me ou ? Pension scheme managerl'aide d'un traitement. Dans certains cas, la sciatique peut revenir (r?cidiver). ?Colgate-PalmoliveQuelles sont les causes ? ?Cette affection est caus?e par une pression exerc?e sur le nerf sciatique ou par un pincement du nerf. Cela peut ?tre d? ? : ?Une saillie excessive (hernie discale) de l'un des disques situ?s entre les os de la colonne vert?brale. ?Des changements au niveau des disques vert?braux li?s ? l'?ge. ?Une douleur qui affecte un muscle de la fesse. ?Une excroissance osseuse pr?s du nerf sciatique. ?Une cassure (fracture) du bassin. ?Memory ArgueUne grossesse. ?Une tumeur. Cela est rare. ?AutoZoneQuels sont les facteurs qui augmentent le risque ? ?Les facteurs suivants peuvent augmenter le risque de d?velopper cette affection : ?Scientist, clinical (histocompatibility and immunogenetics)ratiquer un sport qui exerce une pression ou une contrainte sur la colonne vert?brale. ?Avoir peu de force et de flexibilit?Marland Kitchen. ?Avoir des ant?c?dents m?dicaux Freight forwarderd'intervention chirurgicale ou de l?sion Best Buyau dos. ?Rester assis(e) pendant de longues p?riodes. ?Pratiquer des activit?s qui impliquent des mouvements r?p?titifs de flexion ou de levage. ??tre ob?se. ?Quels sont les signes ou sympt?mes ? ?Les sympt?mes Corning Incorporatedpeuvent aller de l?gers ? tr?s s?v?res, et peuvent inclure : ?L'un des probl?mes suivants au niveau du bas [address]du dos, de la Kenwood Estatesjambe, de la hanche ou des fesses  : ?L?gers picotements, engourdissement ou douleurs sourdes. ?Sensations de br?lure. ?Douleurs aigu?s. ?Sensation d'engourdissement ? l'arri?re du Coca Colamollet ou dans la plante du pied. ?Pepco HoldingsFaiblesse dans les jambes. ?Fortes Ryerson Incdouleurs au dos qui rendent tout mouvement difficile. ?Les sympt?mes peuvent empirer lorsque vous toussez, ?ternuez ou riez, ou lorsque vous restez assis(e) ou debout pendant de longues p?riodes. ?Comment se fait le diagnostic ? ?Le diagnostic de cette affection peut reposer sur : ?Vos sympt?mes et ant?c?dents m?dicaux. ?Un examen clinique. ?Des analyses de sang. ?Des examens d'imagerie tels que : ?Des radiographies. ?Une IRM. ?Une tomodensitom?trie. ?Comment cette affection est-elle trait?e ? ?Dans de nombreux cas, cette affection s'am?liore seule, sans traitement. Toutefois, le traitement peut n?cessiter : ?De r?duire ou modifier son activit? physique. ?Countrywide FinancialDe faire des exercices et des ?tirements. ?L'application de glace et de Group 1 Automotivechaleur sur la zone touch?e. ?Des m?dicaments qui aident ? : ?Soulager les douleurs et The Sherwin-Williamsles gonflements. ?D?tendre les muscles. ?Des injections de m?dicaments qui aident ? Financial plannersoulager la douleur, l'irritation et l'inflammation autour du nerf sciatique (st?ro?des). ?Christy GentlesUne chirurgie. ?Suivez les instructions suivantes ? domicile : ?M?dicaments ?Prenez vos m?dicaments en vente libre et sur Baxter Internationalordonnance en suivant scrupuleusement les instructions de votre prestataire de soins de sant?Marland Kitchen. ?Demandez ? votre prestataire de soins de sant? si le m?dicament qui vous a ?t? prescrit : ?Ne vous permet pas de conduire ou Deere & Companyd'utiliser des machines lourdes. ?Peut provoquer une constipation. Vous devrez peut-?tre prendre les mesures suivantes pour pr?venir ou traiter la constipation : ?Boire des quantit?s suffisantes de liquides pour garder votre urine jaune p?le. ?Prendre des Loews Corporationm?dicaments sur ordonnance ou en vente Bellwoodlibre. ?Consommer des aliments riches en fibres, notamment  des haricots secs, des c?r?ales  compl?tes, des fruits et des l?gumes frais. ?Limiter la consommation d'aliments riches en graisses et les sucres transform?s, par exemple les aliments frits ou sucr?s. ?Gestion de Armed forces logistics/support/administrative officer ?  ?Si le prestataire de soins de sant? vous le recommande, appliquez de la glace sur la zone affect?e. ?Mettez de la American Standard Companies un sac en plastique. ?Placez une serviette entre votre peau et Altria Group. ?Laissez la glace sur votre peau pendant 20 minutes, 2 ? 3 fois par Fifth Third Bancorp. ?Si votre prestataire de soins de sant? vous le recommande, appliquez de la Group 1 Automotive zone touch?e. Oceanographer la source de Training and development officer que vous recommande votre prestataire de soins de sant?, par exemple une compresse ? chaleur humide ou un coussin chauffant. ?Placez une serviette entre votre peau et la source de Training and development officer. ?Laissez la source de chaleur en place pendant 20 ? 30 minutes. ?Retirez la source de chaleur si votre peau devient rouge vif. Cela est particuli?rement important si vous ne ressentez Company secretary, la chaleur ou le froid. Vous risqueriez de vous br?ler. ?Activit?s ? ?Reprenez vos activit?s habituelles en veillant ? suivre les recommandations de votre prestataire de soins de sant?Durwin Nora ? votre prestataire de soins de sant? quelles activit?s sont sans danger pour vous. ??vitez les activit?s qui aggravent vos sympt?mes. ?Prenez de courtes p?riodes de repos tout au long de la journ?e. ?Lorsque vous vous reposez pendant des p?riodes plus longues, ponctuez les p?riodes de repos d'une activit? ou d'un ?tirement mod?r?s. Cela permettra d'?viter les sensations de raideur et les douleurs. ??vitez de rester assis sans bouger pendant de longues p?riodes. Levez-vous et CDW Corporation au moins une fois par Newell Rubbermaid. ?Faites de l'exercice et ?tirez-vous r?guli?rement, comme indiqu? par votre prestataire de soins de sant?Marland Kitchen ?Ne soulevez pas de charges de plus de 4,5 kg (10 livres) lorsque vous ressentez les sympt?mes de la sciatique. ?vitez de Beazer Homes lourds, surtout de mani?re r?p?titive, m?me lorsque vous n'avez pas de sympt?mes. ?Lorsque vous soulevez des Lane, utilisez toujours une technique de levage appropri?e, notamment celle qui consiste ? : ?Fl?chir les genoux. ?Garder la charge pr?s de son corps. ??viter toute torsion. ?Instructions g?n?rales ?Maintenez un poids sain. L'exc?s de poids augmente la contrainte exerc?e sur Constellation Energy. ?Portez des chaussures confortables. ?vitez de porter IT consultant. ??vitez de dormir sur un matelas trop mou ou trop dur. Un matelas assez ferme pour Regions Financial Corporation quand vous dormez peut aider ? r?duire Armed forces logistics/support/administrative officer. ?Rendez-vous ? toutes vos visites de suivi, comme pr?vu par Nucor Corporation de soins de sant?. C'est important. ?Water engineer de soins de sant? si : ?Vous avez des [employer] : ?Vous r?veillent lorsque vous dormez. ?S'aggravent lorsque vous vous allongez. ?Sont plus intenses qu'auparavant. ?Durent plus de 4 semaines. ?Vous perdez du poids de mani?re inexpliqu?e. ?Demandez imm?diatement de l'aide si : ?Vous n'?tes pas capable de contr?ler votre miction ou vos selles (incontinence). ?Vous avez : ?Une faiblesse au niveau du bas [address], du bassin, des fesses ou des jambes qui s'aggrave. ?Une rougeur ou un Federated Department Stores. ?Une sensation de br?lure lorsque vous urinez. ?R?sum? ?La sciatique est Lyondell Chemical, un engourdissement, une faiblesse ou des picotements ressentis le long du nerf sciatique. ?Cette affection est caus?e par une pression exerc?e sur le nerf sciatique ou par un pincement du nerf. ?La sciatique peut provoquer des Lubrizol Corporation, des engourdissements ou des [employer] bas du [place], les [place], les hanches et les fesses. ?Conley Rolls  traitement inclut souvent du repos, Theme park manager, des m?dicaments et l'application de Environmental health practitioner ou de Training and development officer. ?Ces conseils et renseignements ne sauraient se substituer ? l?avis m?dical de votre prestataire de soins de sant?. Par cons?quent,  il est primordial de parler de toutes vos pr?occupations avec votre prestataire de soins de sant?Marland Kitchen ?Document Revised: 12/09/2018 Document Reviewed: 12/09/2018 ?Elsevier Patient Education ? 2022 Elsevier Inc. ?

## 2022-02-13 ENCOUNTER — Ambulatory Visit: Payer: Self-pay | Admitting: *Deleted

## 2022-02-13 NOTE — Telephone Encounter (Signed)
Pt called in using a [place] interpreter Alison Stanton [phone].   She was c/o eye pain.   See triage notes.   The call disconnected during the conversation.   The interpreter tried to call her back but the call would not go through.    ? ? ?

## 2022-02-13 NOTE — Telephone Encounter (Signed)
Reason for Disposition ?? [1] Blurred vision AND [2] new or worsening ? ?Protocols used: Eye Pain and Other Symptoms-A-AH ? ?

## 2022-02-13 NOTE — Telephone Encounter (Signed)
Answer Assessment - Initial Assessment Questions ?1. ONSET: "When did the pain start?" (e.g., minutes, hours, days) ?    Pt called in with [place] interpreter.    ?Pt having eye pain.  Right eye is hurting worse than left one.   No drainage. ?Pain started 2-3 months ago and the pain is getting worse.   ?It's been 3-4 yrs ago when a doctor saw me for my eyes.   I wear glasses.   ?2. TIMING: "Does the pain come and go, or has it been constant since it started?" (e.g., constant, intermittent, fleeting) ?     ?3. SEVERITY: "How bad is the pain?"   (Scale 1-10; mild, moderate or severe) ?  - MILD (1-3): doesn't interfere with normal activities  ?  - MODERATE (4-7): interferes with normal activities or awakens from sleep  ?  - SEVERE (8-10): excruciating pain and patient unable to do normal activities ?    No pinkness in eye. ?4. LOCATION: "Where does it hurt?"  (e.g., eyelid, eye, cheekbone) ?    Lower side of the eye. ?5. CAUSE: "What do you think is causing the pain?" ?    Not asked ?6. VISION: "Do you have blurred vision or changes in your vision?"  ?    The phone line disconnected.   I had the interpreter call her back.   ? ?7. EYE DISCHARGE: "Is there any discharge (pus) from the eye(s)?"  If yes, ask: "What color is it?"  ?    No  ?8. FEVER: "Do you have a fever?" If Yes, ask: "What is it, how was it measured, and when did it start?"  ?    *No Answer* ?9. OTHER SYMPTOMS: "Do you have any other symptoms?" (e.g., headache, nasal discharge, facial rash) ?    *No Answer* ?10. PREGNANCY: "Is there any chance you are pregnant?" "When was your last menstrual period?" ?      *No Answer* ? ?Protocols used: Eye Pain and Other Symptoms-A-AH ? ?

## 2022-02-13 NOTE — Telephone Encounter (Signed)
Pt called back in, had difficulty with the current interpreter, got new interpreter on the [place] # [id number].  ? ?Chief Complaint: eye pain ?Symptoms: R eye pain, 9/10, blurred vision and headache, wears glasses, nose itching ?Frequency: 2-3 months but gotten worse.  ?Pertinent Negatives: NA ?Disposition: [] ED /[x] Urgent Care (no appt availability in office) / [] Appointment(In office/virtual)/ []  [facility]/ [] Home Care/ [] Refused Recommended Disposition /[] [facility]/ []  Follow-up with PCP ?Additional Notes: Pt was advised based on symptoms to present to ED or UC to be evaluated. Pt asked if she had to have an appt to go there and pt was advised she could go to either without an appt and then pt disconnected call. Interpreter tried calling pt back x 2 with no answer.  ? ?

## 2022-02-14 NOTE — Telephone Encounter (Addendum)
Seen in office on 02/12/2022 but did not address eye concerns. Agree with disposition d/t sx's and severity of pain.  ?

## 2022-04-11 ENCOUNTER — Encounter: Payer: Self-pay | Admitting: Physician Assistant

## 2022-04-11 ENCOUNTER — Ambulatory Visit (INDEPENDENT_AMBULATORY_CARE_PROVIDER_SITE_OTHER): Payer: Self-pay | Admitting: Physician Assistant

## 2022-04-11 VITALS — BP 131/88 | HR 104 | Temp 98.8°F | Resp 18 | Ht 63.5 in | Wt 184.0 lb

## 2022-04-11 DIAGNOSIS — H65191 Other acute nonsuppurative otitis media, right ear: Secondary | ICD-10-CM

## 2022-04-11 MED ORDER — AMOXICILLIN-POT CLAVULANATE 875-125 MG PO TABS: 1.0000 | ORAL_TABLET | Freq: Two times a day (BID) | ORAL | 0 refills | Status: DC

## 2022-04-11 NOTE — Progress Notes (Signed)
Patient has not eaten or taken medication today. Patient reports burning in the throat and chest beginning yesterday and not subsiding. Patient reports stinging in the throat. Patient now reports body aches.

## 2022-04-11 NOTE — Progress Notes (Signed)
Established Patient Office Visit  Subjective   Patient ID: Alison Stanton, female    DOB: 1971-04-04  Age: 51 y.o. MRN: [id number]  Chief Complaint  Patient presents with   Gastroesophageal Reflux    Reports that she has been having some burning in her throat, states it radiates all the way up to the top of her head.  States this started yesterday and has not improved.  Does endorse sinus pain and generalized body aches.  Denies sick contacts.  States that she has been staying hydrated but has a low appetite.  States that she has tried ibuprofen which offered some relief from her throat pain but did not resolve her headache.    Due to language barrier, an interpreter was present during the history-taking and subsequent discussion (and for part of the physical exam) with this patient.  Past Medical History:  Diagnosis Date   Chronic headaches 05/28/2017   Decreased visual acuity 05/28/2017   Environmental and seasonal allergies 05/28/2017   Gallstones    Social History   Socioeconomic History   Marital status: Married    Spouse name: Not on file   Number of children: 2   Years of education: 12   Highest education level: Not on file  Occupational History   Occupation: unemployed  Tobacco Use   Smoking status: Never   Smokeless tobacco: Never  Vaping Use   Vaping Use: Never used  Substance and Sexual Activity   Alcohol use: No   Drug use: No   Sexual activity: Yes  Other Topics Concern   Not on file  Social History Narrative   Originally from [place]   Moved to [place]. In [place], [place] originally.  Had her surgery, then left to go back to [place].    [place] and here since.   When came back in November, came to [place] instead of [place] where she was at before.   Has 2 adopted children.   Lives with female friend of family.  Not a couple    Children, ages 51 and 31 yo live in [place]--she is hoping they will join her soon.    Social Determinants of Health    Financial Resource Strain: Not on file  Food Insecurity: Not on file  Transportation Needs: Not on file  Physical Activity: Not on file  Stress: Not on file  Social Connections: Not on file  Intimate Partner Violence: Not on file   Family History  Problem Relation Age of Onset   Hypertension Mother    Headache Father    Hypertension Father    Diabetes Sister    No Known Allergies    Review of Systems  Constitutional:  Negative for chills and fever.  HENT:  Positive for ear pain and sinus pain. Negative for sore throat.   Eyes:  Negative for photophobia.  Respiratory:  Negative for cough and shortness of breath.   Cardiovascular:  Negative for chest pain.  Gastrointestinal:  Negative for abdominal pain, heartburn, nausea and vomiting.  Genitourinary: Negative.   Musculoskeletal:  Positive for myalgias.  Skin: Negative.   Neurological:  Positive for headaches. Negative for dizziness.  Endo/Heme/Allergies: Negative.   Psychiatric/Behavioral: Negative.       Objective:     BP 131/88 (BP Location: Right Arm, Patient Position: Sitting, Cuff Size: Normal)   Pulse (!) 104   Temp 98.8 F (37.1 C) (Oral)   Resp 18   Ht 5' 3.5" (1.613 m)  Wt 184 lb (83.5 kg)   LMP 11/25/2021   SpO2 97%   BMI 32.08 kg/m    Physical Exam Vitals and nursing note reviewed.  Constitutional:      Appearance: Normal appearance.  HENT:     Head: Normocephalic and atraumatic.     Right Ear: No middle ear effusion. Tympanic membrane is erythematous and bulging.     Left Ear: Tympanic membrane, ear canal and external ear normal.     Nose:     Right Turbinates: Not enlarged.     Left Turbinates: Not enlarged.     Right Sinus: Maxillary sinus tenderness and frontal sinus tenderness present.     Left Sinus: Maxillary sinus tenderness and frontal sinus tenderness present.     Mouth/Throat:     Lips: Pink.     Mouth: Mucous membranes are moist.     Pharynx: Oropharynx is clear. No  pharyngeal swelling.     Tonsils: No tonsillar exudate.  Eyes:     Extraocular Movements: Extraocular movements intact.     Conjunctiva/sclera: Conjunctivae normal.     Pupils: Pupils are equal, round, and reactive to light.  Cardiovascular:     Rate and Rhythm: Normal rate and regular rhythm.     Pulses: Normal pulses.     Heart sounds: Normal heart sounds.  Pulmonary:     Effort: Pulmonary effort is normal.     Breath sounds: Normal breath sounds.  Musculoskeletal:        General: Normal range of motion.     Cervical back: Normal range of motion and neck supple.  Skin:    General: Skin is warm and dry.  Neurological:     General: No focal deficit present.     Mental Status: She is alert and oriented to person, place, and time.  Psychiatric:        Mood and Affect: Mood normal.        Behavior: Behavior normal.        Thought Content: Thought content normal.        Judgment: Judgment normal.     Assessment & Plan:   Problem List Items Addressed This Visit   None Visit Diagnoses     Other acute nonsuppurative otitis media of right ear, recurrence not specified    -  Primary   Relevant Medications   amoxicillin-clavulanate (AUGMENTIN) 875-125 MG tablet     1. Other acute nonsuppurative otitis media of right ear, recurrence not specified Trial Augmentin.  Patient education given on supportive care, red flags given for prompt reevaluation. - amoxicillin-clavulanate (AUGMENTIN) 875-125 MG tablet; Take 1 tablet by mouth 2 (two) times daily.  Dispense: 20 tablet; Refill: 0   I have reviewed the patient's medical history (PMH, PSH, Social History, Family History, Medications, and allergies) , and have been updated if relevant. I spent 20 minutes reviewing chart and  face to face time with patient.   Return if symptoms worsen or fail to improve.    Kasandra Knudsen Mayers, PA-C

## 2022-04-11 NOTE — Patient Instructions (Signed)
You are going to take Augmentin twice a day for 10 days.  You can use Tylenol or ibuprofen over-the-counter to help you with the pain.  Make sure you get plenty of rest and drink lots of fluids.  I hope that you feel better soon.  Roney Jaffe, PA-C Physician Assistant [facility] [email or web address]   Otite moyenne chez l'adulte Otitis Media, Adult  L'otite moyenne se caractrise par une inflammation de l'oreille moyenne accompagne d'une accumulation de liquide ainsi que de signes et symptmes indiquant une infection aigu. L'oreille moyenne est la partie de l'oreille qui contient les os ncessaires  l'audition. Elle contient galement de l'air, ce qui permet aux sons Youth worker. Lorsque du liquide infect [employer] cet espace, cela engendre une pression et peut causer une infection de l'oreille. La trompe d'Eustache relie l'oreille moyenne  l'arrire du nez (nasopharynx) et, en temps normal, permet  l'air Production manager. Si la trompe d'Eustache est bloque, du Office manager. Quelles sont les causes ? Cette affection est cause par un blocage de la trompe d'Eustache. Celui-ci peut tre d  la prsence de mucus ou  un gonflement de la trompe. Les facteurs pouvant entraner un blocage comprennent : Un rhume ou une infection des voies respiratoires suprieures. Des allergies. Un irritant tel que la fume de tabac. Un largissement des [employer] Pont. Les Nordstrom zones de tissus mous situs  l'arrire de la gorge, derrire le nez et le palais. Elles font partie du systme de dfense (systme immunitaire) de l'organisme. Une [employer] nasopharynx. Une lsion  l'oreille provoque par [employer] de pression (barotraumatisme). Quels sont les facteurs qui augmentent le risque ? Cette affection est plus susceptible de se dvelopper si vous : Fumez ou tes expos(e)   la fume de cigarette. Prsentez une ouverture au niveau de votre palais (fente palatine). Souffrez de reflux gastro-osophagien. Souffrez d'un trouble du systme immunitaire. Quels sont les signes ou symptmes ? Les symptmes de cette affection incluent : Primary school teacher. De la fivre. Une perte d'oue. Une fatigue (lthargie). Du liquide qui s'coule de l'oreille, si le tympan s'est rompu ou a clat. Des bourdonnements dans l'oreille. Comment se fait le diagnostic ?  Cette affection est diagnostique par un examen physique. Pendant l'examen, votre prestataire de soins de sant utilisera un instrument appel otoscope pour examiner l'intrieur de votre oreille et vrifier la prsence de rougeur, de gonflement et de liquide. Il vous posera aussi des questions au sujet de vos symptmes. Votre prestataire de soins de sant pourra galement demander des examens tels que : Une otoscopie pneumatique. Il s'agit d'un test visant  vrifier Engineer, maintenance. Il est ralis en insufflant une petite quantit d'air dans l'oreille. Un tympanogramme. Il s'agit d'un test [employer] si le tympan se dplace correctement en rponse  la pression de l'air prsent dans le conduit auditif. Il fournit un graphique que votre prestataire de soins de sant examinera. Comment cette affection est-elle traite ? Cette affection peut disparatre d'elle-mme au bout de 3  5 jours. Mais si l'affection est cause par une infection bactrienne et ne disparat pas d'elle-mme, ou si elle revient, votre prestataire de soins de sant pourrait : Prescrire un antibiotique pour Architect. Prescrire ou recommander des [employer] pour [employer]. Suivez les instructions suivantes  domicile : Teacher, adult education vos mdicaments en vente libre et sur ordonnance en suivant scrupuleusement les instructions de votre prestataire de soins de sant. Si un  antibiotique vous a t prescrit, prenez-le en suivant les  recommandations de votre prestataire de soins de sant. N'arrtez pas de prendre l'antibiotique mme si vous commencez  vous sentir mieux. Rendez-vous  toutes les visites de suivi. C'est important. Prenez contact avec un prestataire de soins de sant si : Vous Marketing executive. Vous remarquez une grosseur sur votre cou. Vous ne vous sentez pas DIRECTV 5 jours. Votre tat Chief of Staff. Demandez immdiatement de l'aide si : Vous prsentez une douleur svre qui n'est pas matrise par des ONEOK. Vous prsentez une enflure, une rougeur ou une douleur prs de l'oreille. Vous prsentez une raideur [employer]. Vous ne pouvez pas bouger une partie de votre visage (si elle est paralyse). L'os situ  l'arrire de votre oreille (os mastode) est sensible lorsque vous le touchez. Vous souffrez de maux de tte intenses. Rsum Une otite moyenne se caractrise par Alcoa Inc, un endolorissement et un gonflement au niveau de l'oreille moyenne [place], en gnral, provoquent une douleur et une perte d'audition. Cette affection peut disparatre d'elle-mme au bout de 3  5 jours. Si le problme ne disparat pas au bout de 3  5 jours, votre prestataire de soins de sant pourra vous prescrire des mdicaments pour Architect. Si un antibiotique vous a t prescrit, prenez-le en suivant les recommandations de votre prestataire de soins de sant. Respectez toutes les instructions qui vous auront t donnes par [employer] Corporation de soins de sant. Ces conseils et renseignements ne sauraient se substituer  l'avis mdical de votre prestataire de soins de sant. Par consquent, il est primordial de parler de toutes vos proccupations avec votre prestataire de soins de sant. Document Revised: 03/02/2021 Document Reviewed: 03/02/2021 Elsevier Patient Education  2023 [employer].

## 2022-04-23 ENCOUNTER — Emergency Department (HOSPITAL_COMMUNITY): Payer: Self-pay

## 2022-04-23 ENCOUNTER — Other Ambulatory Visit: Payer: Self-pay

## 2022-04-23 ENCOUNTER — Emergency Department (HOSPITAL_COMMUNITY)
Admission: EM | Admit: 2022-04-23 | Discharge: 2022-04-23 | Disposition: A | Discharge status: Home / Self Care | Payer: Self-pay | Attending: Student | Admitting: Student

## 2022-04-23 ENCOUNTER — Encounter (HOSPITAL_COMMUNITY): Payer: Self-pay

## 2022-04-23 DIAGNOSIS — K5900 Constipation, unspecified: Secondary | ICD-10-CM | POA: Insufficient documentation

## 2022-04-23 DIAGNOSIS — R1084 Generalized abdominal pain: Secondary | ICD-10-CM

## 2022-04-23 DIAGNOSIS — E876 Hypokalemia: Secondary | ICD-10-CM | POA: Insufficient documentation

## 2022-04-23 LAB — COMPREHENSIVE METABOLIC PANEL
ALT: 20 U/L (ref 0–44)
AST: 19 U/L (ref 15–41)
Albumin: 3.5 g/dL (ref 3.5–5.0)
Alkaline Phosphatase: 77 U/L (ref 38–126)
Anion gap: 8 (ref 5–15)
BUN: 10 mg/dL (ref 6–20)
CO2: 25 mmol/L (ref 22–32)
Calcium: 9.2 mg/dL (ref 8.9–10.3)
Chloride: 103 mmol/L (ref 98–111)
Creatinine, Ser: 0.63 mg/dL (ref 0.44–1.00)
GFR, Estimated: >60 mL/min (ref >60)
Glucose, Bld: 107 mg/dL — ABNORMAL HIGH (ref 70–99)
Potassium: 3.3 mmol/L — ABNORMAL LOW (ref 3.5–5.1)
Sodium: 136 mmol/L (ref 135–145)
Total Bilirubin: 0.8 mg/dL (ref 0.3–1.2)
Total Protein: 6.8 g/dL (ref 6.5–8.1)

## 2022-04-23 LAB — URINALYSIS, ROUTINE W REFLEX MICROSCOPIC
Bilirubin Urine: NEGATIVE
Glucose, UA: NEGATIVE mg/dL
Hgb urine dipstick: NEGATIVE
Ketones, ur: NEGATIVE mg/dL
Leukocytes,Ua: NEGATIVE
Nitrite: NEGATIVE
Protein, ur: NEGATIVE mg/dL
Specific Gravity, Urine: 1.008 (ref 1.005–1.030)
pH: 5 (ref 5.0–8.0)

## 2022-04-23 LAB — CBC
HCT: 37.6 % (ref 36.0–46.0)
Hemoglobin: 12.1 g/dL (ref 12.0–15.0)
MCH: 28.8 pg (ref 26.0–34.0)
MCHC: 32.2 g/dL (ref 30.0–36.0)
MCV: 89.5 fL (ref 80.0–100.0)
Platelets: 224 10*3/uL (ref 150–400)
RBC: 4.2 MIL/uL (ref 3.87–5.11)
RDW: 12.4 % (ref 11.5–15.5)
WBC: 7.4 10*3/uL (ref 4.0–10.5)
nRBC: 0 % (ref 0.0–0.2)

## 2022-04-23 LAB — LIPASE, BLOOD: Lipase: 45 U/L (ref 11–51)

## 2022-04-23 MED ORDER — POLYETHYLENE GLYCOL 3350 17 GM/SCOOP PO POWD
1.0000 | Freq: Once | ORAL | 0 refills | Status: AC
Start: 2022-04-23 — End: 2022-04-23

## 2022-04-23 MED ORDER — IOHEXOL 300 MG/ML  SOLN
100.0000 mL | Freq: Once | INTRAMUSCULAR | Status: AC | PRN
Start: 2022-04-23 — End: 2022-04-23
  Administered 2022-04-23: 100 mL via INTRAVENOUS

## 2022-04-23 MED ORDER — ONDANSETRON HCL 4 MG PO TABS: 4.0000 mg | ORAL_TABLET | Freq: Four times a day (QID) | ORAL | 0 refills | Status: DC | PRN

## 2022-04-23 MED ORDER — ALUM & MAG HYDROXIDE-SIMETH 200-200-20 MG/5ML PO SUSP
30.0000 mL | Freq: Once | ORAL | Status: AC
  Administered 2022-04-23: 30 mL via ORAL
  Filled 2022-04-23: qty 30

## 2022-04-23 MED ORDER — LIDOCAINE VISCOUS HCL 2 % MT SOLN
15.0000 mL | Freq: Once | OROMUCOSAL | Status: AC
Start: 2022-04-23 — End: 2022-04-23
  Administered 2022-04-23: 15 mL via ORAL
  Filled 2022-04-23: qty 15

## 2022-04-23 MED ORDER — ONDANSETRON HCL 4 MG/2ML IJ SOLN
4.0000 mg | Freq: Once | INTRAMUSCULAR | Status: DC
Start: 2022-04-23 — End: 2022-04-23
  Filled 2022-04-23: qty 2

## 2022-04-23 MED ORDER — KETOROLAC TROMETHAMINE 15 MG/ML IJ SOLN
15.0000 mg | Freq: Once | INTRAMUSCULAR | Status: AC
Start: 2022-04-23 — End: 2022-04-23
  Administered 2022-04-23: 15 mg via INTRAVENOUS
  Filled 2022-04-23: qty 1

## 2022-04-23 NOTE — Discharge Instructions (Signed)
Vous devez Office manager MiraLAX avec une bouteille de 2 L de Gatorade.  Ensuite, buvez 1 tasse de cette solution toutes les heures jusqu' ce qu'elle soit termine.  Cela nettoiera vos intestins.  Prpar pour tre prs de la salle de bain tout au long de ce processus.  Revenez avec toute aggravation des symptmes, mais veuillez appeler et prendre [employer] l'un des bureaux de gastroentrologie ci-joints.   You should mix the MiraLAX with a 2 L bottle of Gatorade.  Then drink 1 cup of this solution every hour until it is finished.  This will clean out your bowels.  Prepared to be near the bathroom throughout this process. Return with any worsening symptoms but please call and make an appointment with one of the gastroenterology offices attached.

## 2022-04-23 NOTE — ED Notes (Signed)
Patient transported to CT 

## 2022-04-23 NOTE — ED Notes (Signed)
Got patient into a gown patient is resting with call bell in reach 

## 2022-04-23 NOTE — ED Provider Notes (Signed)
[facility] EMERGENCY DEPARTMENT Provider Note   CSN: [id number] Arrival date & time: 04/23/22  4098     History  Chief Complaint  Patient presents with   Abdominal Pain    Alison Stanton is a 51 y.o. female with a past medical history of recurrent abdominal pain, GERD and cholecystectomy presenting with abdominal pain.  Says been going on for around a month.  No fevers, chills but does have occasional diarrhea or vomiting.  Says that she can no longer take the pain.  Localizes the majority of it to the right upper quadrant and says it radiates down her leg and is worse anytime she moves.  She was seen by an urgent care they gave her 800 mg ibuprofen without relief.  No urinary or vaginal symptoms.  She was referred to GI twice in the past 2 years but was unable to see them due to insurance concerns.  Abdominal Pain      Home Medications Prior to Admission medications   Medication Sig Start Date End Date Taking? Authorizing Provider  acetaminophen (TYLENOL) 500 MG tablet Take 500 mg by mouth every 6 (six) hours as needed. Patient not taking: Reported on 04/11/2022    [provider]  amoxicillin-clavulanate (AUGMENTIN) 875-125 MG tablet Take 1 tablet by mouth 2 (two) times daily. 04/11/22   Mayers, Cari S, PA-C  cyclobenzaprine (FLEXERIL) 10 MG tablet Take 1 tablet (10 mg total) by mouth 3 (three) times daily as needed for muscle spasms. Patient not taking: Reported on 04/11/2022 02/12/22   Mayers, Cari S, PA-C  ibuprofen (ADVIL) 600 MG tablet Take 1 tablet (600 mg total) by mouth every 8 (eight) hours as needed for moderate pain. Patient not taking: Reported on 04/11/2022 08/04/20   Dahlia Byes A, NP  omeprazole (PRILOSEC) 40 MG capsule Take 1 capsule (40 mg total) by mouth daily before breakfast. Patient not taking: Reported on 04/11/2022 01/29/21   Georgetta Haber, NP  ondansetron (ZOFRAN) 4 MG tablet Take 1 tablet (4 mg total) by mouth every 6 (six) hours as  needed for nausea or vomiting. Patient not taking: Reported on 04/11/2022 01/29/21   Georgetta Haber, NP      Allergies    Patient has no known allergies.    Review of Systems   Review of Systems  Gastrointestinal:  Positive for abdominal pain.    Physical Exam Updated Vital Signs BP 140/86   Pulse 72   Temp 97.7 F (36.5 C) (Oral)   Resp (!) 26   LMP 11/25/2021   SpO2 100%  Physical Exam Vitals and nursing note reviewed.  Constitutional:      Appearance: Normal appearance.  HENT:     Head: Normocephalic and atraumatic.  Eyes:     General: No scleral icterus.    Conjunctiva/sclera: Conjunctivae normal.  Pulmonary:     Effort: Pulmonary effort is normal. No respiratory distress.  Abdominal:     General: Abdomen is flat.     Palpations: Abdomen is soft.     Tenderness: There is generalized abdominal tenderness.     Hernia: No hernia is present.     Comments: Tenderness made worse with manipulation of right lower extremity.  Difficulty to obtain a full exam due to patient's generalized pain  Skin:    General: Skin is warm and dry.     Findings: No rash.  Neurological:     Mental Status: She is alert.  Psychiatric:  Mood and Affect: Mood normal.     ED Results / Procedures / Treatments   Labs (all labs ordered are listed, but only abnormal results are displayed) Labs Reviewed  COMPREHENSIVE METABOLIC PANEL - Abnormal; Notable for the following components:      Result Value   Potassium 3.3 (*)    Glucose, Bld 107 (*)    All other components within normal limits  URINALYSIS, ROUTINE W REFLEX MICROSCOPIC - Abnormal; Notable for the following components:   Color, Urine STRAW (*)    All other components within normal limits  LIPASE, BLOOD  CBC    EKG None  Radiology CT ABDOMEN PELVIS W CONTRAST  Result Date: 04/23/2022 CLINICAL DATA:  Abdominal pain, acute, right lower quadrant EXAM: CT ABDOMEN AND PELVIS WITH CONTRAST TECHNIQUE: Multidetector CT  imaging of the abdomen and pelvis was performed using the standard protocol following bolus administration of intravenous contrast. RADIATION DOSE REDUCTION: This exam was performed according to the departmental dose-optimization program which includes automated exposure control, adjustment of the mA and/or kV according to patient size and/or use of iterative reconstruction technique. CONTRAST:  OMNIPAQUE IOHEXOL 300 MG/ML  SOLN COMPARISON:  None Available. FINDINGS: Lower chest: Minimal atelectasis at the left lung base. Hepatobiliary: No focal liver abnormality is seen. Status post cholecystectomy. No biliary dilatation. Pancreas: Unremarkable. No pancreatic ductal dilatation or surrounding inflammatory changes. Spleen: Normal in size without focal abnormality. Adrenals/Urinary Tract: Adrenal glands are unremarkable. Kidneys are normal, without renal calculi, focal lesion, or hydronephrosis. Bladder is unremarkable. Stomach/Bowel: Study is slightly suboptimal due to some respiratory motion artifacts. stomach is within normal limits. Appendix has a normal appearance without evidence of appendicitis. No evidence of bowel wall thickening, distention, or inflammatory changes. Moderate stool burden in the ascending and transverse colon. Vascular/Lymphatic: No significant vascular findings are present. No enlarged abdominal or pelvic lymph nodes. Reproductive: Uterus and bilateral adnexa are unremarkable. Other: No abdominal wall hernia or abnormality. No abdominopelvic ascites. Musculoskeletal: No acute or significant osseous findings. IMPRESSION: No free air, fluid, inflammatory changes, mass or significant lymphadenopathy. Moderate stool burden in the ascending and transverse colon. Appendix has a normal appearance. Electronically Signed   By: Marjo Bicker M.D.   On: 04/23/2022 15:34    Procedures Procedures   Medications Ordered in ED Medications  alum & mag hydroxide-simeth (MAALOX/MYLANTA) 200-200-20  MG/5ML suspension 30 mL (has no administration in time range)    And  lidocaine (XYLOCAINE) 2 % viscous mouth solution 15 mL (has no administration in time range)  ondansetron (ZOFRAN) injection 4 mg (has no administration in time range)    ED Course/ Medical Decision Making/ A&P                           Medical Decision Making Amount and/or Complexity of Data Reviewed Labs: ordered. Radiology: ordered.  Risk OTC drugs. Prescription drug management.   This patient presents to the ED for concern of abd pain. The differential diagnosis for generalized abdominal pain includes, but is not limited to AAA, gastroenteritis, appendicitis, Bowel obstruction, Bowel perforation. Gastroparesis, DKA, Hernia, Inflammatory bowel disease, mesenteric ischemia, pancreatitis, peritonitis SBP, volvulus.    This is not an exhaustive differential.    Past Medical History / Co-morbidities / Social History: GERD, cholecystectomy    Additional history: Per chart review, patient has had these episodes for multiple years.  She has been referred to GI but has not yet had a colonoscopy or endoscopy  secondary to insurance concerns.   Physical Exam: Pertinent physical exam findings include tenderness to the entire abdomen, worse with manipulation of the right lower extremity  Lab Tests: I ordered, and personally interpreted labs  -Mild hypokalemia 3.3, no concerning findings.   Imaging Studies: I ordered and independently visualized and interpreted imaging which showed moderate stool burden, no other findings. I agree with the radiologist interpretation.   Medications: I ordered medication including GI cocktail and toradol. Reevaluation of the patient after these medicines showed that the patient resolved. I have reviewed the patients home medicines and have made adjustments as needed.   Disposition: 51 year old female presenting with recurrent abdominal pain.  Says it is mostly on the right side and  radiates all throughout her abdomen.  Lab work negative.  CT ordered and shows moderate stool burden.  We discussed her results and she says that she now has health insurance and would be willing to follow-up with GI.  I believe this is appropriate.  I do not feel as though she has a life-threatening cause of abdominal pain today. After consideration of the diagnostic results and the patients response to treatment, I feel that she may be discharged home with return precautions, bowel cleanse and GI follow-up.    Final Clinical Impression(s) / ED Diagnoses Final diagnoses:  Generalized abdominal pain  Constipation, unspecified constipation type    Rx / DC Orders ED Discharge Orders          Ordered    polyethylene glycol powder (GLYCOLAX/MIRALAX) 17 GM/SCOOP powder   Once        04/23/22 1547    ondansetron (ZOFRAN) 4 MG tablet  Every 6 hours PRN        04/23/22 1547           Results and diagnoses were explained to the patient. Return precautions discussed in full. Patient had no additional questions and expressed complete understanding.   This chart was dictated using voice recognition software.  Despite best efforts to proofread,  errors can occur which can change the documentation meaning.    Entire encounter was performed with iPad interpreter.   Woodroe ChenRedwine, Shajuan Musso A, PA-C 04/23/22 1602    Glendora ScoreKommor, Sallye Lunz, MD 04/24/22 [phone]

## 2022-04-23 NOTE — ED Triage Notes (Signed)
Pt reports RLQ pain. Pt reports it started a couple weeks ago. Pt reports pain is 10/10. Pt denies any urinary s/s.Pt reports nausea.

## 2022-09-24 ENCOUNTER — Inpatient Hospital Stay (HOSPITAL_COMMUNITY)
Admission: RE | Admit: 2022-09-24 | Discharge: 2022-09-24 | Disposition: A | Discharge status: Home / Self Care | Payer: Self-pay | Source: Ambulatory Visit

## 2022-09-24 ENCOUNTER — Ambulatory Visit (HOSPITAL_COMMUNITY)
Admission: EM | Admit: 2022-09-24 | Discharge: 2022-09-24 | Disposition: A | Discharge status: Home / Self Care | Payer: Self-pay | Attending: Emergency Medicine | Admitting: Emergency Medicine

## 2022-09-24 ENCOUNTER — Encounter (HOSPITAL_COMMUNITY): Payer: Self-pay

## 2022-09-24 DIAGNOSIS — N898 Other specified noninflammatory disorders of vagina: Secondary | ICD-10-CM | POA: Insufficient documentation

## 2022-09-24 DIAGNOSIS — G8929 Other chronic pain: Secondary | ICD-10-CM | POA: Insufficient documentation

## 2022-09-24 DIAGNOSIS — G43809 Other migraine, not intractable, without status migrainosus: Secondary | ICD-10-CM | POA: Insufficient documentation

## 2022-09-24 DIAGNOSIS — R42 Dizziness and giddiness: Secondary | ICD-10-CM | POA: Insufficient documentation

## 2022-09-24 DIAGNOSIS — R519 Headache, unspecified: Secondary | ICD-10-CM | POA: Insufficient documentation

## 2022-09-24 LAB — COMPREHENSIVE METABOLIC PANEL
ALT: 18 U/L (ref 0–44)
AST: 20 U/L (ref 15–41)
Albumin: 3.9 g/dL (ref 3.5–5.0)
Alkaline Phosphatase: 76 U/L (ref 38–126)
Anion gap: 7 (ref 5–15)
BUN: 12 mg/dL (ref 6–20)
CO2: 26 mmol/L (ref 22–32)
Calcium: 9.3 mg/dL (ref 8.9–10.3)
Chloride: 107 mmol/L (ref 98–111)
Creatinine, Ser: 0.68 mg/dL (ref 0.44–1.00)
GFR, Estimated: >60 mL/min (ref >60)
Glucose, Bld: 87 mg/dL (ref 70–99)
Potassium: 4.4 mmol/L (ref 3.5–5.1)
Sodium: 140 mmol/L (ref 135–145)
Total Bilirubin: 0.3 mg/dL (ref 0.3–1.2)
Total Protein: 7.4 g/dL (ref 6.5–8.1)

## 2022-09-24 LAB — CBC WITH DIFFERENTIAL/PLATELET
Abs Immature Granulocytes: 0.01 10*3/uL (ref 0.00–0.07)
Basophils Absolute: 0.1 10*3/uL (ref 0.0–0.1)
Basophils Relative: 1 %
Eosinophils Absolute: 0.1 10*3/uL (ref 0.0–0.5)
Eosinophils Relative: 2 %
HCT: 40 % (ref 36.0–46.0)
Hemoglobin: 12.9 g/dL (ref 12.0–15.0)
Immature Granulocytes: 0 %
Lymphocytes Relative: 45 %
Lymphs Abs: 2.4 10*3/uL (ref 0.7–4.0)
MCH: 28.2 pg (ref 26.0–34.0)
MCHC: 32.3 g/dL (ref 30.0–36.0)
MCV: 87.5 fL (ref 80.0–100.0)
Monocytes Absolute: 0.5 10*3/uL (ref 0.1–1.0)
Monocytes Relative: 8 %
Neutro Abs: 2.4 10*3/uL (ref 1.7–7.7)
Neutrophils Relative %: 44 %
Platelets: 207 10*3/uL (ref 150–400)
RBC: 4.57 MIL/uL (ref 3.87–5.11)
RDW: 13.2 % (ref 11.5–15.5)
WBC: 5.4 10*3/uL (ref 4.0–10.5)
nRBC: 0 % (ref 0.0–0.2)

## 2022-09-24 LAB — POCT URINALYSIS DIPSTICK, ED / UC
Bilirubin Urine: NEGATIVE
Glucose, UA: NEGATIVE mg/dL
Hgb urine dipstick: NEGATIVE
Ketones, ur: NEGATIVE mg/dL
Leukocytes,Ua: NEGATIVE
Nitrite: NEGATIVE
Protein, ur: NEGATIVE mg/dL
Specific Gravity, Urine: 1.01 (ref 1.005–1.030)
Urobilinogen, UA: 0.2 mg/dL (ref 0.0–1.0)
pH: 5.5 (ref 5.0–8.0)

## 2022-09-24 LAB — CBG MONITORING, ED: Glucose-Capillary: 84 mg/dL (ref 70–99)

## 2022-09-24 NOTE — ED Triage Notes (Signed)
Per interpreter, Pt c/o dizziness, headaches, and body aches x3wks. C/o vaginal itching for 10days. States tylenol is helping with her headaches but not the dizziness.

## 2022-09-24 NOTE — Discharge Instructions (Addendum)
Je ne recommande pas les douches vaginales car le vagin se nettoie tout seul et ne ncessite pas de Occupational hygienist. cela peut tre li  vos dmangeaisons vaginales. nous vous appellerons si quelque chose sur votre couvillon est positif.  I do not recommend douching as the vagina cleans itself and does not need internal cleaning. this may be related to your vaginal itching. we will call you if anything on your swab is positive.  Continuez  prendre du Tylenol en cas de maux de tte. veuillez faire un suivi auprs de votre nouveau fournisseur de soins primaires concernant la migraine. nous vous appellerons si des analyses de sang sont anormales.  Continue tylenol for headache. please follow up with your new primary care provider regarding migraine. we will call you if any blood work returns abnormal.

## 2022-09-24 NOTE — ED Provider Notes (Signed)
[facility]    CSN: [id number] Arrival date & time: 09/24/22  0836     History   Chief Complaint Chief Complaint  Patient presents with   Dizziness    HPI Adlynn Lowenstein Adou is a 51 y.o. female.  Medical interpretor used for this encounter 3 week history of dizziness with headache, intermittently but not every day. Occasional nausea History of chronic headaches, migraines Takes Tylenol for headache that helps  Does not currently have headache, reports some dizziness in clinic. Denies any weakness, numbness, tingling.  No head injury or traumas She does not have a primary care provider  Additionally 10 days of vaginal itching internally.  Denies rash. No vaginal discharge  She reports douching frequently   Past Medical History:  Diagnosis Date   Chronic headaches 05/28/2017   Decreased visual acuity 05/28/2017   Environmental and seasonal allergies 05/28/2017   Gallstones     Patient Active Problem List   Diagnosis Date Noted   Decreased visual acuity 05/28/2017   Chronic headaches 05/28/2017   Environmental and seasonal allergies 05/28/2017    Past Surgical History:  Procedure Laterality Date   CHOLECYSTECTOMY  04/2016   [facility]  [place]    OB History   No obstetric history on file.      Home Medications    Prior to Admission medications   Not on File    Family History Family History  Problem Relation Age of Onset   Hypertension Mother    Headache Father    Hypertension Father    Diabetes Sister     Social History Social History   Tobacco Use   Smoking status: Never   Smokeless tobacco: Never  Vaping Use   Vaping Use: Never used  Substance Use Topics   Alcohol use: No   Drug use: No     Allergies   Patient has no known allergies.   Review of Systems Review of Systems  Neurological:  Positive for dizziness.    Per HPI  Physical Exam Triage Vital Signs ED Triage Vitals  Enc Vitals Group     BP  09/24/22 0904 115/88     Pulse Rate 09/24/22 0904 77     Resp 09/24/22 0904 18     Temp 09/24/22 0904 97.9 F (36.6 C)     Temp Source 09/24/22 0904 Oral     SpO2 09/24/22 0904 100 %     Weight --      Height --      Head Circumference --      Peak Flow --      Pain Score 09/24/22 0905 8     Pain Loc --      Pain Edu? --      Excl. in GC? --    No data found.  Updated Vital Signs BP 115/88 (BP Location: Left Arm)   Pulse 77   Temp 97.9 F (36.6 C) (Oral)   Resp 18   LMP 11/25/2021   SpO2 100%    Physical Exam Vitals and nursing note reviewed.  Constitutional:      General: She is not in acute distress. HENT:     Nose: No congestion.     Mouth/Throat:     Mouth: Mucous membranes are moist.     Pharynx: Oropharynx is clear. No posterior oropharyngeal erythema.  Eyes:     Extraocular Movements: Extraocular movements intact.     Conjunctiva/sclera: Conjunctivae normal.     Pupils: Pupils are  equal, round, and reactive to light.  Cardiovascular:     Rate and Rhythm: Normal rate and regular rhythm.     Pulses: Normal pulses.     Heart sounds: Normal heart sounds.  Pulmonary:     Effort: Pulmonary effort is normal.     Breath sounds: Normal breath sounds.  Musculoskeletal:        General: Normal range of motion.  Skin:    General: Skin is warm and dry.  Neurological:     General: No focal deficit present.     Mental Status: She is alert and oriented to person, place, and time.     Cranial Nerves: Cranial nerves 2-12 are intact. No cranial nerve deficit or facial asymmetry.     Sensory: Sensation is intact.     Motor: Motor function is intact.     Coordination: Coordination is intact.     Comments: Strength and sensation intact throughout       UC Treatments / Results  Labs (all labs ordered are listed, but only abnormal results are displayed) Labs Reviewed  CBC WITH DIFFERENTIAL/PLATELET  COMPREHENSIVE METABOLIC PANEL  CBG MONITORING, ED  POCT  URINALYSIS DIPSTICK, ED / UC  CERVICOVAGINAL ANCILLARY ONLY    EKG   Radiology No results found.  Procedures Procedures (including critical care time)  Medications Ordered in UC Medications - No data to display  Initial Impression / Assessment and Plan / UC Course  I have reviewed the triage vital signs and the nursing notes.  Pertinent labs & imaging results that were available during my care of the patient were reviewed by me and considered in my medical decision making (see chart for details).  No focal neuro deficit on exam Vitals stable CBG 84 Urinalysis unremarkable  Cytology swab pending. Discussed discontinuing douching and only using water externally. CBC and CMP pending.  Scheduled with PCP via open scheduling for follow up  I believe symptoms are related to migraines. No headache in clinic today and well appearing. Discussed return and ED precautions. Patient agrees to plan  Final Clinical Impressions(s) / UC Diagnoses   Final diagnoses:  Other migraine without status migrainosus, not intractable  Dizziness  Vaginal itching     Discharge Instructions      Je ne recommande pas les douches vaginales car le vagin se nettoie tout seul et ne ncessite pas de nettoyage interne. cela peut tre li  vos dmangeaisons vaginales. nous vous appellerons si quelque chose sur votre couvillon est positif.  I do not recommend douching as the vagina cleans itself and does not need internal cleaning. this may be related to your vaginal itching. we will call you if anything on your swab is positive.  Continuez  prendre du Tylenol en cas de maux de tte. veuillez faire un suivi auprs de votre nouveau fournisseur de soins primaires concernant la migraine. nous vous appellerons si des analyses de sang sont anormales.  Continue tylenol for headache. please follow up with your new primary care provider regarding migraine. we will call you if any blood work returns  abnormal.      ED Prescriptions   None    PDMP not reviewed this encounter.   Teagyn Fishel, Lurena Joiner, [place] 09/24/22 1143

## 2022-09-25 ENCOUNTER — Ambulatory Visit (HOSPITAL_COMMUNITY)
Admission: EM | Admit: 2022-09-25 | Discharge: 2022-09-25 | Disposition: A | Discharge status: Home / Self Care | Payer: Self-pay | Attending: Internal Medicine | Admitting: Internal Medicine

## 2022-09-25 ENCOUNTER — Telehealth (HOSPITAL_COMMUNITY): Payer: Self-pay | Admitting: Emergency Medicine

## 2022-09-25 DIAGNOSIS — Z113 Encounter for screening for infections with a predominantly sexual mode of transmission: Secondary | ICD-10-CM | POA: Insufficient documentation

## 2022-09-25 LAB — CERVICOVAGINAL ANCILLARY ONLY
Comment: NEGATIVE
Comment: NEGATIVE
Comment: NEGATIVE
Comment: NEGATIVE
Comment: NEGATIVE
Comment: NORMAL

## 2022-09-25 NOTE — Telephone Encounter (Signed)
Patient will need to return for recollect of cyto swab due to "insufficient materials".   Reviewed with patient using a french interpreter, she verbalized understanding and will return today

## 2022-09-25 NOTE — ED Triage Notes (Signed)
Pt here for recollect cyto

## 2022-09-27 ENCOUNTER — Telehealth (HOSPITAL_COMMUNITY): Payer: Self-pay | Admitting: Emergency Medicine

## 2022-09-27 LAB — CERVICOVAGINAL ANCILLARY ONLY
Comment: NEGATIVE
Comment: NEGATIVE
Comment: NEGATIVE
Comment: NEGATIVE
Comment: NEGATIVE
Comment: NORMAL

## 2022-09-27 NOTE — Telephone Encounter (Signed)
Received notification that patient will need to return for a second time due to the swab end not being placed in the vial, only the stick.  They were unable to run.   Attempted to reach patient x 1 using a french interpreter, unable to LVM

## 2022-10-01 NOTE — Telephone Encounter (Signed)
Patient has been updated.  PLEASE USE A FRENCH INTERPRETER TO REVIEW THE INSTRUCTIONS WITH PATIENT WHEN SHE RETURNS.

## 2022-10-07 ENCOUNTER — Ambulatory Visit: Payer: Self-pay | Admitting: Family Medicine

## 2022-10-07 ENCOUNTER — Telehealth: Payer: Self-pay | Admitting: Family Medicine

## 2022-10-07 NOTE — Telephone Encounter (Signed)
Pt walked In around 9:45 for New Patient appt. I asked about collecting estimated $158 and she said that she was unaware. I stated that we collect upfront and so she then walked away.

## 2022-10-11 NOTE — Telephone Encounter (Signed)
Dismissed from [place] only. Pt walked out when asked for payment.

## 2023-03-08 ENCOUNTER — Emergency Department (HOSPITAL_BASED_OUTPATIENT_CLINIC_OR_DEPARTMENT_OTHER): Payer: Self-pay

## 2023-03-08 ENCOUNTER — Emergency Department (HOSPITAL_BASED_OUTPATIENT_CLINIC_OR_DEPARTMENT_OTHER): Payer: Self-pay | Admitting: Radiology

## 2023-03-08 ENCOUNTER — Other Ambulatory Visit: Payer: Self-pay

## 2023-03-08 ENCOUNTER — Emergency Department (HOSPITAL_BASED_OUTPATIENT_CLINIC_OR_DEPARTMENT_OTHER)
Admission: EM | Admit: 2023-03-08 | Discharge: 2023-03-08 | Disposition: A | Discharge status: Home / Self Care | Payer: Self-pay | Attending: Emergency Medicine | Admitting: Emergency Medicine

## 2023-03-08 DIAGNOSIS — R0602 Shortness of breath: Secondary | ICD-10-CM

## 2023-03-08 DIAGNOSIS — Z1152 Encounter for screening for COVID-19: Secondary | ICD-10-CM | POA: Insufficient documentation

## 2023-03-08 DIAGNOSIS — R519 Headache, unspecified: Secondary | ICD-10-CM

## 2023-03-08 DIAGNOSIS — R112 Nausea with vomiting, unspecified: Secondary | ICD-10-CM | POA: Insufficient documentation

## 2023-03-08 DIAGNOSIS — J40 Bronchitis, not specified as acute or chronic: Secondary | ICD-10-CM | POA: Insufficient documentation

## 2023-03-08 DIAGNOSIS — R079 Chest pain, unspecified: Secondary | ICD-10-CM

## 2023-03-08 LAB — CBC WITH DIFFERENTIAL/PLATELET
Abs Immature Granulocytes: 0.02 10*3/uL (ref 0.00–0.07)
Basophils Absolute: 0 10*3/uL (ref 0.0–0.1)
Basophils Relative: 1 %
Eosinophils Absolute: 0.5 10*3/uL (ref 0.0–0.5)
Eosinophils Relative: 9 %
HCT: 39 % (ref 36.0–46.0)
Hemoglobin: 12.6 g/dL (ref 12.0–15.0)
Immature Granulocytes: 0 %
Lymphocytes Relative: 36 %
Lymphs Abs: 2.1 10*3/uL (ref 0.7–4.0)
MCH: 28.4 pg (ref 26.0–34.0)
MCHC: 32.3 g/dL (ref 30.0–36.0)
MCV: 87.8 fL (ref 80.0–100.0)
Monocytes Absolute: 0.2 10*3/uL (ref 0.1–1.0)
Monocytes Relative: 4 %
Neutro Abs: 3 10*3/uL (ref 1.7–7.7)
Neutrophils Relative %: 50 %
Platelets: 215 10*3/uL (ref 150–400)
RBC: 4.44 MIL/uL (ref 3.87–5.11)
RDW: 13.6 % (ref 11.5–15.5)
WBC: 5.9 10*3/uL (ref 4.0–10.5)
nRBC: 0 % (ref 0.0–0.2)

## 2023-03-08 LAB — RESP PANEL BY RT-PCR (RSV, FLU A&B, COVID)  RVPGX2
Influenza A by PCR: NEGATIVE
Influenza B by PCR: NEGATIVE
Resp Syncytial Virus by PCR: NEGATIVE
SARS Coronavirus 2 by RT PCR: NEGATIVE

## 2023-03-08 LAB — COMPREHENSIVE METABOLIC PANEL
ALT: 24 U/L (ref 0–44)
AST: 28 U/L (ref 15–41)
Albumin: 4.3 g/dL (ref 3.5–5.0)
Alkaline Phosphatase: 67 U/L (ref 38–126)
Anion gap: 7 (ref 5–15)
BUN: 11 mg/dL (ref 6–20)
CO2: 27 mmol/L (ref 22–32)
Calcium: 9.1 mg/dL (ref 8.9–10.3)
Chloride: 106 mmol/L (ref 98–111)
Creatinine, Ser: 0.7 mg/dL (ref 0.44–1.00)
GFR, Estimated: >60 mL/min (ref >60)
Glucose, Bld: 95 mg/dL (ref 70–99)
Potassium: 3.4 mmol/L — ABNORMAL LOW (ref 3.5–5.1)
Sodium: 140 mmol/L (ref 135–145)
Total Bilirubin: 0.4 mg/dL (ref 0.3–1.2)
Total Protein: 7.4 g/dL (ref 6.5–8.1)

## 2023-03-08 LAB — TROPONIN I (HIGH SENSITIVITY)
Troponin I (High Sensitivity): 3 ng/L (ref <18)
Troponin I (High Sensitivity): 2 ng/L (ref <18)

## 2023-03-08 MED ORDER — PREDNISONE 50 MG PO TABS
60.0000 mg | ORAL_TABLET | Freq: Once | ORAL | Status: AC
  Administered 2023-03-08: 60 mg via ORAL
  Filled 2023-03-08: qty 1

## 2023-03-08 MED ORDER — AEROCHAMBER PLUS FLO-VU MISC
1.0000 | Freq: Once | Status: AC
  Administered 2023-03-08: 1
  Filled 2023-03-08: qty 1

## 2023-03-08 MED ORDER — IOHEXOL 350 MG/ML SOLN
100.0000 mL | Freq: Once | INTRAVENOUS | Status: AC | PRN
  Administered 2023-03-08: 100 mL via INTRAVENOUS

## 2023-03-08 MED ORDER — PREDNISONE 10 MG PO TABS: 20.0000 mg | ORAL_TABLET | Freq: Every day | ORAL | 0 refills | Status: AC

## 2023-03-08 MED ORDER — BENZONATATE 100 MG PO CAPS
200.0000 mg | ORAL_CAPSULE | Freq: Once | ORAL | Status: AC
  Administered 2023-03-08: 200 mg via ORAL
  Filled 2023-03-08: qty 2

## 2023-03-08 MED ORDER — ALBUTEROL SULFATE HFA 108 (90 BASE) MCG/ACT IN AERS
2.0000 | INHALATION_SPRAY | Freq: Once | RESPIRATORY_TRACT | Status: AC
  Administered 2023-03-08: 2 via RESPIRATORY_TRACT
  Filled 2023-03-08: qty 6.7

## 2023-03-08 MED ORDER — BENZONATATE 100 MG PO CAPS: 100.0000 mg | ORAL_CAPSULE | Freq: Three times a day (TID) | ORAL | 0 refills | Status: DC | PRN

## 2023-03-08 NOTE — ED Provider Notes (Signed)
[place] EMERGENCY DEPARTMENT AT [facility] Provider Note   CSN: [id number] Arrival date & time: 03/08/23  0746     History  Chief Complaint  Patient presents with   Cough    Alison Stanton is a 52 y.o. female.  HPI     52yo female presents with concern for cough, dyspnea, chest pain, hemoptysis.   Reports cough for 3 days, congestion, some sore throat.  Last week went to take a shower and noticed it was a bit dirty, used [employer] and ever since using this has not felt comfortable.  Has had shortness of breath, body aches, cough. Has been spitting up blood.  Nausea, vomiting, fever off and on Cough to the point of vomiting Shortness of breath, chest pain Feels like needs to be in shower to breath Chest pain  Has also had headache going form back to front, nausea. No numbness/weakness/facial droop.  Feels like vision has been foggier.  NO double vision or missing pieces of vision.   Past Medical History:  Diagnosis Date   Chronic headaches 05/28/2017   Decreased visual acuity 05/28/2017   Environmental and seasonal allergies 05/28/2017   Gallstones      Home Medications Prior to Admission medications   Medication Sig Start Date End Date Taking? Authorizing Provider  benzonatate (TESSALON) 100 MG capsule Take 1 capsule (100 mg total) by mouth 3 (three) times daily as needed for cough. 03/08/23  Yes Alvira Monday, MD  predniSONE (DELTASONE) 10 MG tablet Take 2 tablets (20 mg total) by mouth daily for 4 days. 03/08/23 03/12/23 Yes Alvira Monday, MD      Allergies    Patient has no known allergies.    Review of Systems   Review of Systems  Physical Exam Updated Vital Signs BP 129/85   Pulse 88   Temp 97.7 F (36.5 C) (Oral)   Resp 16   LMP 11/25/2021   SpO2 99%  Physical Exam Vitals and nursing note reviewed.  Constitutional:      General: She is not in acute distress.    Appearance: She is well-developed. She is not diaphoretic.  HENT:      Head: Normocephalic and atraumatic.  Eyes:     Conjunctiva/sclera: Conjunctivae normal.  Cardiovascular:     Rate and Rhythm: Normal rate and regular rhythm.     Heart sounds: Normal heart sounds. No murmur heard.    No friction rub. No gallop.  Pulmonary:     Effort: Pulmonary effort is normal. No respiratory distress.     Breath sounds: Normal breath sounds. No wheezing or rales.  Abdominal:     General: There is no distension.     Palpations: Abdomen is soft.     Tenderness: There is no abdominal tenderness. There is no guarding.  Musculoskeletal:        General: No tenderness.     Cervical back: Normal range of motion.  Skin:    General: Skin is warm and dry.     Findings: No erythema or rash.  Neurological:     Mental Status: She is alert and oriented to person, place, and time.     ED Results / Procedures / Treatments   Labs (all labs ordered are listed, but only abnormal results are displayed) Labs Reviewed  COMPREHENSIVE METABOLIC PANEL - Abnormal; Notable for the following components:      Result Value   Potassium 3.4 (*)    All other components within normal limits  RESP  PANEL BY RT-PCR (RSV, FLU A&B, COVID)  [id number]  CBC WITH DIFFERENTIAL/PLATELET  TROPONIN I (HIGH SENSITIVITY)  TROPONIN I (HIGH SENSITIVITY)    EKG EKG Interpretation  Date/Time:  Saturday March 08 2023 10:12:41 EDT Ventricular Rate:  79 PR Interval:  147 QRS Duration: 94 QT Interval:  389 QTC Calculation: 446 R Axis:   41 Text Interpretation: Sinus rhythm No significant change since last tracing Confirmed by Alvira Monday ([id number]) on 03/08/2023 12:59:56 PM  Radiology CT Angio Chest PE W and/or Wo Contrast  Result Date: 03/08/2023 CLINICAL DATA:  Evaluate for acute pulmonary embolism. High probability. Shortness of breath with hemoptysis. EXAM: CT ANGIOGRAPHY CHEST WITH CONTRAST TECHNIQUE: Multidetector CT imaging of the chest was performed using the standard protocol during bolus  administration of intravenous contrast. Multiplanar CT image reconstructions and MIPs were obtained to evaluate the vascular anatomy. RADIATION DOSE REDUCTION: This exam was performed according to the departmental dose-optimization program which includes automated exposure control, adjustment of the mA and/or kV according to patient size and/or use of iterative reconstruction technique. CONTRAST:  OMNIPAQUE IOHEXOL 350 MG/ML SOLN COMPARISON:  None Available. FINDINGS: Cardiovascular: Satisfactory opacification of the pulmonary arteries to the segmental level. No evidence of pulmonary embolism. Upper limits of normal heart size. No pericardial effusion. Aortic atherosclerosis. Mediastinum/Nodes: No enlarged mediastinal, hilar, or axillary lymph nodes. Thyroid gland, trachea, and esophagus demonstrate no significant findings. Lungs/Pleura: No pleural effusion, airspace consolidation or pneumothorax. Scattered bandlike areas of scarring versus subsegmental atelectasis noted in the anterior right upper lobe, lingula and right middle lobe. No suspicious pulmonary nodules identified. Upper Abdomen: No acute abnormality.  Cholecystectomy. Musculoskeletal: No chest wall abnormality. No acute or significant osseous findings. Review of the MIP images confirms the above findings. IMPRESSION: 1. No evidence for acute pulmonary embolism. 2. Scattered bandlike areas of scarring versus subsegmental atelectasis noted in the anterior right upper lobe, lingula and right middle lobe. 3. Upper limits of normal heart size. 4.  Aortic Atherosclerosis (ICD10-I70.0). Electronically Signed   By: Signa Kell M.D.   On: 03/08/2023 13:08   CT Head Wo Contrast  Result Date: 03/08/2023 CLINICAL DATA:  Headache, new onset (Age >= 51y) EXAM: CT HEAD WITHOUT CONTRAST TECHNIQUE: Contiguous axial images were obtained from the base of the skull through the vertex without intravenous contrast. RADIATION DOSE REDUCTION: This exam was  performed according to the departmental dose-optimization program which includes automated exposure control, adjustment of the mA and/or kV according to patient size and/or use of iterative reconstruction technique. COMPARISON:  01/18/2020. FINDINGS: Brain: No evidence of acute infarction, hemorrhage, hydrocephalus, extra-axial collection or mass lesion/mass effect. Vascular: No hyperdense vessel or unexpected calcification. Skull: Normal. Negative for fracture or focal lesion. Sinuses/Orbits: No acute finding. IMPRESSION: No acute intracranial process. Electronically Signed   By: Layla Maw M.D.   On: 03/08/2023 11:31   DG Chest Port 1 View  Result Date: 03/08/2023 CLINICAL DATA:  Cough. EXAM: PORTABLE CHEST 1 VIEW COMPARISON:  02/23/2019 FINDINGS: Heart size is normal. Stable cardiomediastinal contours. Decreased lung volumes with asymmetric elevation of the right hemidiaphragm. Platelike atelectasis versus scar noted in the left midlung. Visualized osseous structures are unremarkable. IMPRESSION: Decreased lung volumes with platelike atelectasis versus scar in the left midlung. Electronically Signed   By: Signa Kell M.D.   On: 03/08/2023 09:28    Procedures Procedures    Medications Ordered in ED Medications  albuterol (VENTOLIN HFA) 108 (90 Base) MCG/ACT inhaler 2 puff (2 puffs Inhalation Given 03/08/23 0803)  predniSONE (DELTASONE) tablet 60 mg (60 mg Oral Given 03/08/23 0814)  benzonatate (TESSALON) capsule 200 mg (200 mg Oral Given 03/08/23 0814)  aerochamber plus with mask device 1 each (1 each Other Given 03/08/23 0804)  iohexol (OMNIPAQUE) 350 MG/ML injection 100 mL (100 mLs Intravenous Contrast Given 03/08/23 1114)    ED Course/ Medical Decision Making/ A&P                              52yo female presents with concern for cough, dyspnea, chest pain, hemoptysis.   Chest XR completed and personally evaluated by me and radiology shows decreased lung volumes, platelike  atelectasis versus scar.  COVID/flu/RSV testing negative.  EKG completed and personally evaluated by me and shows no acute ST changes.  Labs completed and personally evaluated by me show no leukocytosis, normal hgb, no significant electrolyte abnormalities, troponin negative x2.   Also reports headache.  CT head completed to evaluate for ICH shows no acute abnormalities.  CT PE study completed given hemoptysis, dyspnea, chest pain shows no evidence of PE or pneumonia-does show bandlike areas of scarring.  Symptoms began after bleach exposure--possible chemical triggered RAD, or viral bronchitis.  Feels improved after inhaler in ED.  Given rx for steroids, recommend albuterol prn, tessalon.  Prior to dc does described eye discomfort, no significant pain to suggest abrasion, normal pupils, no sign of glaucoma or orbital cellulitis. No double vision or visual field deficits and doubt CVA.  Recommend outpt ophthalmology follow up.   Discharged with  rx for prednisone, albuterol, tessalon and recommend supportive care, PCP Follow up. Patient discharged in stable condition with understanding of reasons to return.         Final Clinical Impression(s) / ED Diagnoses Final diagnoses:  Bronchitis  Acute nonintractable headache, unspecified headache type  Chest pain, unspecified type  Shortness of breath    Rx / DC Orders ED Discharge Orders          Ordered    predniSONE (DELTASONE) 10 MG tablet  Daily        03/08/23 1332    benzonatate (TESSALON) 100 MG capsule  3 times daily PRN        03/08/23 1332              Alvira Monday, MD 03/08/23 2149

## 2023-03-08 NOTE — ED Notes (Signed)
Discharge instructions, medications and follow up care reviewed and explained. Pt verbalized understanding and had no further questions.   

## 2023-03-08 NOTE — ED Triage Notes (Signed)
Cough congestion for 3 days, no medical hx via interpreter ipad [place]).

## 2023-07-11 ENCOUNTER — Other Ambulatory Visit: Payer: Self-pay

## 2023-07-11 ENCOUNTER — Encounter (HOSPITAL_COMMUNITY): Payer: Self-pay | Admitting: *Deleted

## 2023-07-11 ENCOUNTER — Ambulatory Visit (HOSPITAL_COMMUNITY)
Admission: EM | Admit: 2023-07-11 | Discharge: 2023-07-11 | Disposition: A | Discharge status: Home / Self Care | Payer: Self-pay | Attending: Family Medicine | Admitting: Family Medicine

## 2023-07-11 DIAGNOSIS — H8110 Benign paroxysmal vertigo, unspecified ear: Secondary | ICD-10-CM

## 2023-07-11 MED ORDER — ONDANSETRON 4 MG PO TBDP
4.0000 mg | ORAL_TABLET | Freq: Once | ORAL | Status: AC
  Administered 2023-07-11: 4 mg via ORAL

## 2023-07-11 MED ORDER — ONDANSETRON HCL 4 MG PO TABS: 4.0000 mg | ORAL_TABLET | Freq: Four times a day (QID) | ORAL | 0 refills | Status: DC

## 2023-07-11 MED ORDER — ONDANSETRON 4 MG PO TBDP
ORAL_TABLET | ORAL | Status: AC
  Filled 2023-07-11: qty 1

## 2023-07-11 MED ORDER — MECLIZINE HCL 25 MG PO TABS: 25.0000 mg | ORAL_TABLET | Freq: Three times a day (TID) | ORAL | 0 refills | Status: DC | PRN

## 2023-07-11 NOTE — ED Triage Notes (Signed)
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

## 2023-07-11 NOTE — Discharge Instructions (Addendum)
Vous avez t vu aujourd'hui pour des tourdissements [employer], ce qui est probablement un vertige. Je vous ai donn des informations  ce sujet aujourd'hui.  Je vous ai donn un mdicament aujourd'hui pour soulager les nauses.  J'ai envoy des [employer] pour soulager les nauses et les tourdissements.  Je vous recommande de vous reposer suffisamment et Insurance underwriter de liquides.  Si votre tat ne s'amliore pas ou ne s'aggrave pas, Microbiologist revenir pour [employer].  You were seen today for dizziness and nausea, which is likley vertigo. I have given you information on this today.  I have given you a medication today to help with nausea.  I have sent medications to the pharmacy to help with nausea and dizziness.  I recommed you get plenty of rest and increase fluids.  If you are not improvnig or worsening then please return for re-evaluation.

## 2023-07-11 NOTE — ED Triage Notes (Signed)
Pt reports since Monday she has had a fever,nausea and has not been able to eat. Pt also feels dizzy when she stands.

## 2023-07-11 NOTE — ED Provider Notes (Signed)
[facility]    CSN: [id number] Arrival date & time: 07/11/23  1439      History   Chief Complaint Chief Complaint  Patient presents with   Fever   Nausea    HPI Alison Stanton is a 52 y.o. female.    Fever Associated symptoms: nausea    [place] interpreter used today.  Patient is here for a ?fever, and feeling dizzy.  Fever started about 5 days ago, she does not have a thermometer at home.   She is also having some nausea.  She did vomit once yesterday.  At night she cannot sleep because the room starts spinning.  This causes nausea as well.  This also started on Monday evening.  She has not had anything to eat since yesterday.  She did have some water and OJ this morning.  No runny nose, congestion drainage.  No home medications.  She has been taking tylenol.        Past Medical History:  Diagnosis Date   Chronic headaches 05/28/2017   Decreased visual acuity 05/28/2017   Environmental and seasonal allergies 05/28/2017   Gallstones     Patient Active Problem List   Diagnosis Date Noted   Decreased visual acuity 05/28/2017   Chronic headaches 05/28/2017   Environmental and seasonal allergies 05/28/2017    Past Surgical History:  Procedure Laterality Date   CHOLECYSTECTOMY  04/2016   [facility]  [place]    OB History   No obstetric history on file.      Home Medications    Prior to Admission medications   Medication Sig Start Date End Date Taking? Authorizing Provider  benzonatate (TESSALON) 100 MG capsule Take 1 capsule (100 mg total) by mouth 3 (three) times daily as needed for cough. 03/08/23   Alvira Monday, MD    Family History Family History  Problem Relation Age of Onset   Hypertension Mother    Headache Father    Hypertension Father    Diabetes Sister     Social History Social History   Tobacco Use   Smoking status: Never   Smokeless tobacco: Never  Vaping Use   Vaping status: Never Used   Substance Use Topics   Alcohol use: No   Drug use: No     Allergies   Patient has no known allergies.   Review of Systems Review of Systems  Constitutional:  Positive for fever.  HENT: Negative.    Respiratory: Negative.    Cardiovascular: Negative.   Gastrointestinal:  Positive for nausea.  Genitourinary: Negative.   Musculoskeletal: Negative.   Neurological:  Positive for dizziness. Negative for weakness, light-headedness and numbness.     Physical Exam Triage Vital Signs ED Triage Vitals  Encounter Vitals Group     BP 07/11/23 1511 125/87     Systolic BP Percentile --      Diastolic BP Percentile --      Pulse Rate 07/11/23 1511 67     Resp 07/11/23 1511 18     Temp 07/11/23 1511 98.9 F (37.2 C)     Temp src --      SpO2 07/11/23 1511 98 %     Weight --      Height --      Head Circumference --      Peak Flow --      Pain Score 07/11/23 1509 0     Pain Loc --      Pain Education --  Exclude from Growth Chart --    No data found.  Updated Vital Signs BP 125/87   Pulse 67   Temp 98.9 F (37.2 C)   Resp 18   LMP 11/25/2021   SpO2 98%   Visual Acuity Right Eye Distance:   Left Eye Distance:   Bilateral Distance:    Right Eye Near:   Left Eye Near:    Bilateral Near:     Physical Exam Constitutional:      General: She is not in acute distress.    Appearance: Normal appearance. She is ill-appearing.  HENT:     Head: Normocephalic.     Right Ear: Tympanic membrane normal.     Left Ear: Tympanic membrane normal.     Nose: Nose normal.     Mouth/Throat:     Mouth: Mucous membranes are moist.  Cardiovascular:     Rate and Rhythm: Normal rate and regular rhythm.  Pulmonary:     Effort: Pulmonary effort is normal.     Breath sounds: Normal breath sounds.  Musculoskeletal:     Cervical back: Normal range of motion and neck supple.  Skin:    General: Skin is warm.  Neurological:     General: No focal deficit present.     Mental  Status: She is alert and oriented to person, place, and time.     Motor: No weakness.     Coordination: Coordination normal.     Comments: Dix-halpike very positive on the right;  did not do the left due to patient discomfort  Psychiatric:        Mood and Affect: Mood normal.        Behavior: Behavior normal.      UC Treatments / Results  Labs (all labs ordered are listed, but only abnormal results are displayed) Labs Reviewed - No data to display  EKG   Radiology No results found.  Procedures Procedures (including critical care time)  Medications Ordered in UC Medications - No data to display  Initial Impression / Assessment and Plan / UC Course  I have reviewed the triage vital signs and the nursing notes.  Pertinent labs & imaging results that were available during my care of the patient were reviewed by me and considered in my medical decision making (see chart for details).   Patient seen today for reported fever, with dizziness and nausea.  She  does not have a thermometer at home, and she does not have a fever here, so I am unsure if true fever.  I do, however, believe she has vertigo based on symptoms and exam.  I have treated her for this today.  Please see below.   Final Clinical Impressions(s) / UC Diagnoses   Final diagnoses:  Benign paroxysmal positional vertigo, unspecified laterality     Discharge Instructions      Vous avez t vu aujourd'hui pour des tourdissements et des [place], ce qui est probablement un vertige. Je vous ai donn des informations  ce sujet aujourd'hui.  Je vous ai donn un mdicament aujourd'hui pour soulager les nauses.  J'ai envoy des [employer] pour soulager les nauses et les tourdissements.  Je vous recommande de vous reposer suffisamment et Insurance underwriter de liquides.  Si votre tat ne s'amliore pas ou ne s'aggrave pas, Microbiologist revenir pour [employer].  You were seen today for  dizziness and nausea, which is likley vertigo. I have given you information on this today.  I have  given you a medication today to help with nausea.  I have sent medications to the pharmacy to help with nausea and dizziness.  I recommed you get plenty of rest and increase fluids.  If you are not improvnig or worsening then please return for re-evaluation.     ED Prescriptions     Medication Sig Dispense Auth. Provider   ondansetron (ZOFRAN) 4 MG tablet Take 1 tablet (4 mg total) by mouth every 6 (six) hours. 12 tablet Tracen Mahler, MD   meclizine (ANTIVERT) 25 MG tablet Take 1 tablet (25 mg total) by mouth 3 (three) times daily as needed for dizziness. 30 tablet Jannifer Franklin, MD      PDMP not reviewed this encounter.   Jannifer Franklin, MD 07/11/23 [phone]

## 2023-08-23 ENCOUNTER — Ambulatory Visit (HOSPITAL_COMMUNITY)
Admission: EM | Admit: 2023-08-23 | Discharge: 2023-08-23 | Disposition: A | Discharge status: Home / Self Care | Payer: Self-pay | Attending: Emergency Medicine | Admitting: Emergency Medicine

## 2023-08-23 ENCOUNTER — Encounter (HOSPITAL_COMMUNITY): Payer: Self-pay

## 2023-08-23 DIAGNOSIS — K5900 Constipation, unspecified: Secondary | ICD-10-CM

## 2023-08-23 DIAGNOSIS — M545 Low back pain, unspecified: Secondary | ICD-10-CM

## 2023-08-23 DIAGNOSIS — R1084 Generalized abdominal pain: Secondary | ICD-10-CM

## 2023-08-23 MED ORDER — POLYETHYLENE GLYCOL 3350 17 G PO PACK: 17.0000 g | PACK | Freq: Every day | ORAL | 0 refills | Status: DC

## 2023-08-23 MED ORDER — DOCUSATE SODIUM 100 MG PO CAPS: 100.0000 mg | ORAL_CAPSULE | Freq: Two times a day (BID) | ORAL | 0 refills | Status: DC

## 2023-08-23 MED ORDER — KETOROLAC TROMETHAMINE 60 MG/2ML IM SOLN
30.0000 mg | Freq: Once | INTRAMUSCULAR | Status: AC
  Administered 2023-08-23: 30 mg via INTRAMUSCULAR

## 2023-08-23 MED ORDER — KETOROLAC TROMETHAMINE 30 MG/ML IJ SOLN
INTRAMUSCULAR | Status: AC
  Filled 2023-08-23: qty 1

## 2023-08-23 NOTE — ED Triage Notes (Signed)
Patient here today with c/o abd pain and LB pain X 2 weeks. She tried taking IBU 800 mg with no relief.

## 2023-08-23 NOTE — ED Provider Notes (Signed)
[facility]    CSN: [id number] Arrival date & time: 08/23/23  1356      History   Chief Complaint Chief Complaint  Patient presents with   Abdominal Pain    HPI [name] is a 52 y.o. female.   Patient presents to clinic for generalized abdominal pain that has been present for the past 2 weeks and over the past week it is moved into her lower back and down her legs.  She has tried taking ibuprofen without much relief.  She denies any dysuria or urinary changes.  No nausea or vomiting.  No diarrhea.  She does endorse constipation.  Endorses pain with bending and twisting, sitting to standing.  At night when she is sleeping she cannot turn on the other side due to her back pain.  Denies incontinence. No inner leg numbness. No falls or trauma.   She has been constipated over the past 2 weeks and has been using a liquid medication she obtained from a hospital in [place], unsure of the medication.  She did have a bowel movement this morning that was small hard balls of stool that she had to strain to get out.  She has a history of chronic abdominal pain, constipation, GERD and cholecystectomy.    The history is provided by the patient and medical records. The history is limited by a language barrier. A language interpreter was used.  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      Home Medications    Prior to Admission medications   Medication Sig Start Date End Date Taking? Authorizing Provider  docusate sodium (COLACE) 100 MG capsule Take 1 capsule (100 mg total) by mouth every 12 (twelve) hours. 08/23/23  Yes Rinaldo Ratel, [place] N, FNP  polyethylene glycol (MIRALAX) 17 g packet Take 17 g by mouth daily. 08/23/23  Yes Niasha Devins, [place] N, FNP    Family History Family History  Problem Relation Age of Onset   Hypertension Mother    Headache Father    Hypertension Father    Diabetes Sister     Social History Social History   Tobacco Use   Smoking status: Never   Smokeless tobacco: Never  Vaping Use   Vaping status: Never Used  Substance Use Topics   Alcohol use: No   Drug use: No     Allergies   Patient has no known allergies.   Review of Systems Review of Systems  Constitutional:  Negative for fever.  Gastrointestinal:  Positive for abdominal pain and constipation. Negative for diarrhea, nausea and vomiting.  Genitourinary:  Negative for dysuria, hematuria, pelvic pain and urgency.  Musculoskeletal:  Positive for back pain. Negative for gait problem.     Physical Exam Triage Vital Signs ED Triage Vitals  Encounter Vitals Group     BP 08/23/23 1442 112/78  Systolic BP Percentile --      Diastolic BP Percentile --      Pulse Rate 08/23/23 1442 78     Resp 08/23/23 1442 16     Temp 08/23/23 1442 98.1 F (36.7 C)     Temp Source 08/23/23 1442 Oral     SpO2 08/23/23 1442 97 %     Weight --      Height --      Head Circumference --      Peak Flow --      Pain Score 08/23/23 1440 10     Pain Loc --      Pain Education --      Exclude from Growth Chart --    No data found.  Updated Vital Signs BP 112/78 (BP Location: Right Arm)   Pulse 78   Temp 98.1 F (36.7 C) (Oral)   Resp 16   LMP 11/25/2021   SpO2 97%   Visual Acuity Right Eye Distance:   Left Eye Distance:   Bilateral  Distance:    Right Eye Near:   Left Eye Near:    Bilateral Near:     Physical Exam Vitals and nursing note reviewed.  Constitutional:      Appearance: She is well-developed.  HENT:     Head: Normocephalic and atraumatic.     Right Ear: External ear normal.     Left Ear: External ear normal.     Nose: Nose normal.     Mouth/Throat:     Mouth: Mucous membranes are moist.  Cardiovascular:     Rate and Rhythm: Normal rate.     Heart sounds: Normal heart sounds.  Pulmonary:     Effort: Pulmonary effort is normal. No respiratory distress.  Abdominal:     General: Abdomen is flat. Bowel sounds are normal.     Palpations: Abdomen is soft.     Tenderness: There is generalized abdominal tenderness. There is right CVA tenderness and left CVA tenderness. There is no guarding or rebound.  Musculoskeletal:        General: Tenderness present. No swelling, deformity or signs of injury. Normal range of motion.     Cervical back: Normal.     Thoracic back: Normal.     Lumbar back: Tenderness present.       Back:     Comments: Diffuse low back pain that is TTP.   Skin:    General: Skin is warm and dry.  Neurological:     General: No focal deficit present.     Mental Status: She is alert and oriented to person, place, and time.  Psychiatric:        Mood and Affect: Mood normal.        Behavior: Behavior normal. Behavior is cooperative.      UC Treatments / Results  Labs (all labs ordered are listed, but only abnormal results are displayed) Labs Reviewed - No data to display  EKG   Radiology No results found.  Procedures Procedures (including critical care time)  Medications Ordered in UC Medications  ketorolac (TORADOL) injection 30 mg (has no administration in time range)    Initial Impression / Assessment and Plan / UC Course  I have reviewed the triage vital signs and the nursing notes.  Pertinent labs & imaging results that were available during my care of the  patient were reviewed by me and considered in my medical decision making (see chart for details).  Vitals and triage  reviewed, patient is hemodynamically stable.  Abdomen is soft with active BS and diffusely tender, without rebound or guarding.  Low back is also diffusely tender.  Atraumatic.  Patient endorses constipation, will trial MiraLAX and Colace.  IM Toradol given for back pain.  Range of motion intact with discomfort.  Without inner leg numbness, incontinence or concerning segments for cauda equina or other emergent etiologies.  Strict emergency precautions given, follow-up care discussed.  Patient verbalized understanding, no questions at this time.     Final Clinical Impressions(s) / UC Diagnoses   Final diagnoses:  Lumbar back pain  Generalized abdominal pain  Constipation, unspecified constipation type     Discharge Instructions      Nous vous avons administr une injection de Toradol pour soulager vos [employer]. Vous pouvez utiliser de Software engineer, des tirements doux et des anti-inflammatoires si Publishing rights manager.  Veuillez vous assurer de boire au moins 2 litres d'eau par jour et de manger beaucoup de fruits et lgumes frais. vitez les aliments frits, hautement transforms ou artificiels. Utilisez le Colace tous les jours pour Psychologist, sport and exercise vos selles. Vous pouvez [employer] fois par jour pour favoriser un bon transit intestinal. Il ne doit pas l'utiliser pendant plus d'une semaine  la fois. Veuillez consulter un mdecin gnraliste pour [employer] plus approfondie de votre constipation. Consultez immdiatement un mdecin si vous dveloppez de fortes douleurs abdominales, de la fivre ou tout nouveau symptme inquitant.  We have given you an injection of Toradol to help with your back pain.  You can use heat, gentle stretching and anti-inflammatories as needed.  Please ensure you are drinking at least 64 ounces of water daily, and eating plenty of fresh  fruits and vegetables.  Avoid fried, heavily processed or artificial foods.  Use the Colace every day to help soften your stools.  You can use the MiraLAX once daily to help promote good bowel movements.  He should not be using this for longer than a week at a time.  Please follow-up with a primary care provider for further evaluation of your constipation.  Seek immediate care if you develop severe abdominal pain, fever, or any new concerning symptoms.      ED Prescriptions     Medication Sig Dispense Auth. Provider   polyethylene glycol (MIRALAX) 17 g packet Take 17 g by mouth daily. 14 each Mya Suell, [place] N, FNP   docusate sodium (COLACE) 100 MG capsule Take 1 capsule (100 mg total) by mouth every 12 (twelve) hours. 60 capsule Ladarrell Cornwall, [place] N, [place]      PDMP not reviewed this encounter.   Husayn Reim, [place] N, [place] 08/23/23 [phone]

## 2023-08-23 NOTE — Discharge Instructions (Addendum)
Nous vous Financial controller injection de Toradol pour soulager vos maux de [place]. Vous pouvez utiliser de Software engineer, des tirements doux et des anti-inflammatoires si Publishing rights manager.  Veuillez vous assurer de boire au moins 2 litres d'eau par jour et de manger beaucoup de fruits et lgumes frais. vitez les aliments frits, hautement transforms ou artificiels. Utilisez le Colace tous les jours pour Psychologist, sport and exercise vos selles. Vous pouvez [employer] fois par jour pour favoriser un bon transit intestinal. Il ne doit pas l'utiliser pendant plus d'une semaine  la fois. Veuillez consulter un mdecin gnraliste pour [employer] plus approfondie de votre constipation. Consultez immdiatement un mdecin si vous dveloppez de fortes douleurs abdominales, de la fivre ou tout nouveau symptme inquitant.  We have given you an injection of Toradol to help with your back pain.  You can use heat, gentle stretching and anti-inflammatories as needed.  Please ensure you are drinking at least 64 ounces of water daily, and eating plenty of fresh fruits and vegetables.  Avoid fried, heavily processed or artificial foods.  Use the Colace every day to help soften your stools.  You can use the MiraLAX once daily to help promote good bowel movements.  He should not be using this for longer than a week at a time.  Please follow-up with a primary care provider for further evaluation of your constipation.  Seek immediate care if you develop severe abdominal pain, fever, or any new concerning symptoms.

## 2023-12-04 ENCOUNTER — Ambulatory Visit (HOSPITAL_COMMUNITY)
Admission: EM | Admit: 2023-12-04 | Discharge: 2023-12-04 | Disposition: A | Discharge status: Home / Self Care | Payer: No Typology Code available for payment source | Attending: Family Medicine | Admitting: Family Medicine

## 2023-12-04 ENCOUNTER — Encounter (HOSPITAL_COMMUNITY): Payer: Self-pay

## 2023-12-04 DIAGNOSIS — J069 Acute upper respiratory infection, unspecified: Secondary | ICD-10-CM

## 2023-12-04 LAB — POCT RAPID STREP A (OFFICE): Rapid Strep A Screen: NEGATIVE

## 2023-12-04 LAB — POC COVID19/FLU A&B COMBO
Covid Antigen, POC: NEGATIVE
Influenza A Antigen, POC: NEGATIVE
Influenza B Antigen, POC: NEGATIVE

## 2023-12-04 MED ORDER — BENZONATATE 200 MG PO CAPS: 200.0000 mg | ORAL_CAPSULE | Freq: Three times a day (TID) | ORAL | 0 refills | Status: DC | PRN

## 2023-12-04 NOTE — ED Provider Notes (Signed)
[facility]    CSN: [id number] Arrival date & time: 12/04/23  1130      History   Chief Complaint Chief Complaint  Patient presents with   Sore Throat   Fever    HPI Alison Stanton is a 53 y.o. female.    Sore Throat Pertinent negatives include no shortness of breath.  Fever Associated symptoms: congestion, cough, rhinorrhea, sore throat and vomiting    [place] interpreter used today.  She is here sore throat, and "bleeding" from her throat.  This started 2 days ago.  She cannot eat due to pain.  She states she noted some blood this morning with coughing.  She states she has a fever, but does not have a thermometer at home.  She has runny nose, congestion, and a cough.  She has a headache, body aches.  She did have vomiting, last was yesterday.  She has used tylenol for symptoms.   No known sick contacts.        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      Home Medications    Prior to Admission medications   Medication Sig Start Date End Date Taking? Authorizing Provider  docusate sodium (COLACE) 100 MG capsule Take 1 capsule (100 mg total) by mouth every 12 (twelve) hours. 08/23/23   Garrison, [place] N, FNP  polyethylene glycol (MIRALAX) 17 g packet Take 17 g by mouth daily. 08/23/23   Garrison, [place] N, FNP    Family History Family History  Problem Relation Age of Onset   Hypertension Mother    Headache Father    Hypertension Father    Diabetes Sister     Social  History Social History   Tobacco Use   Smoking status: Never   Smokeless tobacco: Never  Vaping Use   Vaping status: Never Used  Substance Use Topics   Alcohol use: No   Drug use: No     Allergies   Patient has no known allergies.   Review of Systems Review of Systems  Constitutional:  Positive for fever.  HENT:  Positive for congestion, rhinorrhea and sore throat.   Respiratory:  Positive for cough. Negative for shortness of breath and wheezing.   Gastrointestinal:  Positive for vomiting.  Musculoskeletal: Negative.   Skin: Negative.   Psychiatric/Behavioral: Negative.       Physical Exam Triage Vital Signs ED Triage Vitals [12/04/23 1325]  Encounter Vitals Group     BP 118/76     Systolic BP Percentile      Diastolic BP Percentile      Pulse Rate 79     Resp 16     Temp 98.2 F (36.8 C)     Temp Source Oral     SpO2 98 %     Weight      Height      Head Circumference      Peak Flow      Pain Score      Pain Loc  Pain Education      Exclude from Growth Chart    No data found.  Updated Vital Signs BP 118/76 (BP Location: Left Arm)   Pulse 79   Temp 98.2 F (36.8 C) (Oral)   Resp 16   LMP 11/25/2021   SpO2 98%   Visual Acuity Right Eye Distance:   Left Eye Distance:   Bilateral Distance:    Right Eye Near:   Left Eye Near:    Bilateral Near:     Physical Exam Constitutional:      General: She is not in acute distress.    Appearance: She is well-developed and normal weight. She is not ill-appearing or toxic-appearing.  HENT:     Right Ear: Tympanic membrane normal.     Left Ear: Tympanic membrane normal.     Nose: Congestion and rhinorrhea present.     Mouth/Throat:     Mouth: Mucous membranes are moist.     Pharynx: Posterior oropharyngeal erythema present. No pharyngeal swelling or oropharyngeal exudate.     Tonsils: No tonsillar exudate.     Comments: No bleeding noted, not friable Neck:     Thyroid: No thyromegaly.   Cardiovascular:     Rate and Rhythm: Normal rate and regular rhythm.     Heart sounds: Normal heart sounds.  Pulmonary:     Effort: Pulmonary effort is normal.     Breath sounds: Normal breath sounds.  Abdominal:     Palpations: Abdomen is soft.  Musculoskeletal:     Cervical back: Normal range of motion and neck supple.  Lymphadenopathy:     Cervical: No cervical adenopathy.  Skin:    General: Skin is warm.  Neurological:     General: No focal deficit present.     Mental Status: She is alert.  Psychiatric:        Mood and Affect: Mood normal.      UC Treatments / Results  Labs (all labs ordered are listed, but only abnormal results are displayed) Labs Reviewed  POCT RAPID STREP A (OFFICE) - Normal  CULTURE, GROUP A STREP ([facility])  POC COVID19/FLU A&B COMBO    EKG   Radiology No results found.  Procedures Procedures (including critical care time)  Medications Ordered in UC Medications - No data to display  Initial Impression / Assessment and Plan / UC Course  I have reviewed the triage vital signs and the nursing notes.  Pertinent labs & imaging results that were available during my care of the patient were reviewed by me and considered in my medical decision making (see chart for details).   Final Clinical Impressions(s) / UC Diagnoses   Final diagnoses:  Upper respiratory tract infection, unspecified type     Discharge Instructions      Vous avez t vu aujourd'hui pour des symptmes des voies respiratoires suprieures.  Votre test streptococcique tait ngatif.  Votre prlvement grippe/covid tait ngatif.  Vos symptmes semblent de nature virale.  Vous avez peut-tre remarqu un peu de sang d  une irritation de la gorge due  la toux.  J'ai envoy un mdicament pour soulager la toux.  Vous pouvez prendre du Tylenol ou du Motrin contre la douleur/la fivre, ainsi que des gargarismes  l'eau sale tide.  Si vos symptmes s'aggravent ou ne  s'amliorent pas, veuillez revenir pour [employer].  You were seen today for upper respiratory symptoms.  Your strep test was negative.  Your flu/covid swab was negative.  Your symptoms appear viral in  nature.  You may have noted a bit of blood from irritation to the throat from coughing.  I have sent out a medication to help with cough.  You may take tylenol or motrin for pain/fever, as well as warm salt water gargles.  If you have worsening symptoms or not improving then please return for re-evaluation.      ED Prescriptions     Medication Sig Dispense Auth. Provider   benzonatate (TESSALON) 200 MG capsule Take 1 capsule (200 mg total) by mouth 3 (three) times daily as needed for cough. 21 capsule Jannifer Franklin, MD      PDMP not reviewed this encounter.   Jannifer Franklin, MD 12/04/23 1435

## 2023-12-04 NOTE — ED Triage Notes (Signed)
Sore throat, fever and cough x 2 days.

## 2023-12-04 NOTE — Discharge Instructions (Signed)
Vous avez t vu aujourd'hui pour des [employer] voies respiratoires suprieures.  Votre test streptococcique tait ngatif.  Votre prlvement grippe/covid tait ngatif.  Vos symptmes semblent de nature virale.  Vous avez peut-tre remarqu un peu de sang d  une irritation de la gorge due  la toux.  J'ai envoy un mdicament pour soulager la toux.  Vous pouvez prendre du Tylenol ou du Motrin contre la douleur/la fivre, ainsi que des gargarismes  l'eau sale tide.  Si vos symptmes s'aggravent ou ne s'amliorent pas, veuillez revenir pour [employer].  You were seen today for upper respiratory symptoms.  Your strep test was negative.  Your flu/covid swab was negative.  Your symptoms appear viral in nature.  You may have noted a bit of blood from irritation to the throat from coughing.  I have sent out a medication to help with cough.  You may take tylenol or motrin for pain/fever, as well as warm salt water gargles.  If you have worsening symptoms or not improving then please return for re-evaluation.

## 2023-12-07 LAB — CULTURE, GROUP A STREP (THRC)

## 2023-12-31 ENCOUNTER — Encounter (HOSPITAL_COMMUNITY): Payer: Self-pay

## 2023-12-31 ENCOUNTER — Ambulatory Visit (HOSPITAL_COMMUNITY)
Admission: RE | Admit: 2023-12-31 | Discharge: 2023-12-31 | Disposition: A | Discharge status: Home / Self Care | Payer: No Typology Code available for payment source | Source: Ambulatory Visit | Attending: Emergency Medicine | Admitting: Emergency Medicine

## 2023-12-31 VITALS — BP 125/79 | HR 81 | Temp 98.1°F | Resp 16

## 2023-12-31 DIAGNOSIS — M79645 Pain in left finger(s): Secondary | ICD-10-CM | POA: Diagnosis not present

## 2023-12-31 MED ORDER — DEXAMETHASONE SODIUM PHOSPHATE 10 MG/ML IJ SOLN
INTRAMUSCULAR | Status: AC
  Filled 2023-12-31: qty 1

## 2023-12-31 MED ORDER — DEXAMETHASONE SODIUM PHOSPHATE 10 MG/ML IJ SOLN
10.0000 mg | Freq: Once | INTRAMUSCULAR | Status: AC
  Administered 2023-12-31: 10 mg via INTRAMUSCULAR

## 2023-12-31 NOTE — ED Triage Notes (Signed)
 Per [employer] [phone]  Patient c/o left thumb pain that radiates into the other fingers on the left to the left shoulder x 3 weeks.  Patient states the pain" feels like something is walking and walking to the other fingers and into my shoulder."  Patient denies injury.  Patient states she has been taking Tylenol and Ibuprofen.

## 2023-12-31 NOTE — Discharge Instructions (Addendum)
 We have given you a steroid injection in clinic today to help with inflammation causing your pain. Otherwise I recommend alternating between Tylenol and Ibuprofen as needed for pain. You can also alternate between heat and ice as needed. I have a attached [facility] that you can follow-up with if pain persists. Return here as needed.   Nous vous Research officer, political party une injection de strodes en clinique aujourd'hui pour soulager l'inflammation  l'origine de Nurse, adult. Sinon, je recommande [employer] Tylenol et [employer] selon les besoins en cas de douleur. Vous pouvez galement alterner [employer] chaleur et glace selon vos besoins. J'ai ci-joint un [facility] avec lequel vous pouvez faire un suivi si la douleur persiste. Revenez ici si ncessaire.

## 2023-12-31 NOTE — ED Provider Notes (Addendum)
 [facility]    CSN: [id number] Arrival date & time: 12/31/23  1145      History   Chief Complaint Chief Complaint  Patient presents with   thumb pain    HPI Alison Stanton is a 53 y.o. female.   Patient presents with left thumb pain that radiates to other fingers and up her left arm x 3 weeks. Denies any known injury. Denies numbness, tingling, and weakness. Patient reports she has been taking Tylenol and Ibuprofen with some relief, but the pain has progressively gotten worse.      Past Medical History:  Diagnosis Date   Chronic headaches 05/28/2017   Decreased visual acuity 05/28/2017   Environmental and seasonal allergies 05/28/2017   Gallstones     Patient Active Problem List   Diagnosis Date Noted   Decreased visual acuity 05/28/2017   Chronic headaches 05/28/2017   Environmental and seasonal allergies 05/28/2017    Past Surgical History:  Procedure Laterality Date   CHOLECYSTECTOMY  04/2016   [facility]  [place]    OB History   No obstetric history on file.      Home Medications    Prior to Admission medications   Medication Sig Start Date End Date Taking? Authorizing Provider  benzonatate (TESSALON) 200 MG capsule Take 1 capsule (200 mg total) by mouth 3 (three) times daily as needed for cough. 12/04/23   Jannifer Aryiana Klinkner, MD    Family History Family History  Problem Relation Age of Onset   Hypertension Mother    Headache Father    Hypertension Father    Diabetes Sister     Social History Social History   Tobacco Use   Smoking status: Never   Smokeless tobacco: Never  Vaping Use   Vaping status: Never Used  Substance Use Topics   Alcohol use: No   Drug use: No     Allergies   Patient has no known allergies.   Review of Systems Review of Systems  Per HPI  Physical Exam Triage Vital Signs ED Triage Vitals [12/31/23 1208]  Encounter Vitals Group     BP      Systolic BP Percentile      Diastolic  BP Percentile      Pulse      Resp      Temp      Temp src      SpO2      Weight      Height      Head Circumference      Peak Flow      Pain Score 10     Pain Loc      Pain Education      Exclude from Growth Chart    No data found.  Updated Vital Signs BP 125/79 (BP Location: Left Arm)   Pulse 81   Temp 98.1 F (36.7 C)   Resp 16   LMP 11/25/2021   SpO2 98%   Visual Acuity Right Eye Distance:   Left Eye Distance:   Bilateral Distance:    Right Eye Near:   Left Eye Near:    Bilateral Near:     Physical Exam Vitals and nursing note reviewed.  Constitutional:      General: She is awake. She is not in acute distress.    Appearance: Normal appearance. She is well-developed and well-groomed. She is not ill-appearing.  Musculoskeletal:     Left shoulder: No tenderness or bony tenderness. Normal range of motion.  Left upper arm: No tenderness or bony tenderness.     Left elbow: Normal range of motion. No tenderness.     Left forearm: No tenderness or bony tenderness.     Left wrist: No swelling, deformity, tenderness, bony tenderness or snuff box tenderness. Normal range of motion.     Left hand: Tenderness present. No swelling, deformity or bony tenderness. Normal range of motion.  Neurological:     Mental Status: She is alert.  Psychiatric:        Behavior: Behavior is cooperative.      UC Treatments / Results  Labs (all labs ordered are listed, but only abnormal results are displayed) Labs Reviewed - No data to display  EKG   Radiology No results found.  Procedures Procedures (including critical care time)  Medications Ordered in UC Medications  dexamethasone (DECADRON) injection 10 mg (10 mg Intramuscular Given 12/31/23 1237)    Initial Impression / Assessment and Plan / UC Course  I have reviewed the triage vital signs and the nursing notes.  Pertinent labs & imaging results that were available during my care of the patient were reviewed by  me and considered in my medical decision making (see chart for details).     Patient presented with 3-week history of progressively worsening left thumb pain that now radiates to other fingers and arm.   Upon assessment mild tenderness noted to thumb without decreased ROM, swelling, or deformity. Reports increased pain with movement.  No tenderness, movement pain, swelling, or decreased ROM noticed to other fingers, wrist, elbow, and shoulder.  Given IM Decadron in clinic to help decrease inflammation causing her pain. Recommended alternating between Tylenol and Ibuprofen for pain. Discussed follow-up and return precautions.  Final Clinical Impressions(s) / UC Diagnoses   Final diagnoses:  Thumb pain, left     Discharge Instructions      We have given you a steroid injection in clinic today to help with inflammation causing your pain. Otherwise I recommend alternating between Tylenol and Ibuprofen as needed for pain. You can also alternate between heat and ice as needed. I have a attached [facility] that you can follow-up with if pain persists. Return here as needed.   Nous vous Research officer, political party une injection de strodes en clinique aujourd'hui pour soulager l'inflammation  l'origine de Nurse, adult. Sinon, je recommande [employer] Tylenol et [employer] selon les besoins en cas de douleur. Vous pouvez galement alterner [employer] chaleur et glace selon vos besoins. J'ai ci-joint un [facility] avec lequel vous pouvez faire un suivi si la douleur persiste. Revenez ici si ncessaire.    ED Prescriptions   None    PDMP not reviewed this encounter.   Letta Kocher, NP 12/31/23 1236    Wynonia Lawman A, NP 12/31/23 1246

## 2024-03-25 ENCOUNTER — Emergency Department (HOSPITAL_COMMUNITY)
Admission: EM | Admit: 2024-03-25 | Discharge: 2024-03-25 | Disposition: A | Discharge status: Home / Self Care | Attending: Emergency Medicine | Admitting: Emergency Medicine

## 2024-03-25 ENCOUNTER — Other Ambulatory Visit: Payer: Self-pay

## 2024-03-25 ENCOUNTER — Emergency Department (HOSPITAL_COMMUNITY)

## 2024-03-25 DIAGNOSIS — R0602 Shortness of breath: Secondary | ICD-10-CM | POA: Diagnosis not present

## 2024-03-25 DIAGNOSIS — B349 Viral infection, unspecified: Secondary | ICD-10-CM | POA: Insufficient documentation

## 2024-03-25 DIAGNOSIS — J029 Acute pharyngitis, unspecified: Secondary | ICD-10-CM | POA: Diagnosis present

## 2024-03-25 LAB — CBC
HCT: 37.6 % (ref 36.0–46.0)
Hemoglobin: 12.1 g/dL (ref 12.0–15.0)
MCH: 28.7 pg (ref 26.0–34.0)
MCHC: 32.2 g/dL (ref 30.0–36.0)
MCV: 89.3 fL (ref 80.0–100.0)
Platelets: 205 10*3/uL (ref 150–400)
RBC: 4.21 MIL/uL (ref 3.87–5.11)
RDW: 13.6 % (ref 11.5–15.5)
WBC: 7.4 10*3/uL (ref 4.0–10.5)
nRBC: 0 % (ref 0.0–0.2)

## 2024-03-25 LAB — BASIC METABOLIC PANEL WITH GFR
Anion gap: 8 (ref 5–15)
BUN: 10 mg/dL (ref 6–20)
CO2: 24 mmol/L (ref 22–32)
Calcium: 9 mg/dL (ref 8.9–10.3)
Chloride: 105 mmol/L (ref 98–111)
Creatinine, Ser: 0.82 mg/dL (ref 0.44–1.00)
GFR, Estimated: >60 mL/min
Glucose, Bld: 110 mg/dL — ABNORMAL HIGH (ref 70–99)
Potassium: 4.1 mmol/L (ref 3.5–5.1)
Sodium: 137 mmol/L (ref 135–145)

## 2024-03-25 LAB — GROUP A STREP BY PCR: Group A Strep by PCR: NOT DETECTED

## 2024-03-25 LAB — TROPONIN I (HIGH SENSITIVITY): Troponin I (High Sensitivity): 3 ng/L

## 2024-03-25 LAB — BRAIN NATRIURETIC PEPTIDE: B Natriuretic Peptide: 19.7 pg/mL (ref 0.0–100.0)

## 2024-03-25 MED ORDER — ACETAMINOPHEN 500 MG PO TABS
1000.0000 mg | ORAL_TABLET | Freq: Once | ORAL | Status: AC
  Administered 2024-03-25: 1000 mg via ORAL
  Filled 2024-03-25: qty 2

## 2024-03-25 MED ORDER — FAMOTIDINE 20 MG PO TABS
20.0000 mg | ORAL_TABLET | Freq: Once | ORAL | Status: AC
  Administered 2024-03-25: 20 mg via ORAL
  Filled 2024-03-25: qty 1

## 2024-03-25 NOTE — ED Notes (Signed)
 NT called CMD@7 :50am

## 2024-03-25 NOTE — Discharge Instructions (Addendum)
 Evaluation today was overall reassuring.  Suspect this is a viral illness.  Please continue conservative treatment at home which includes rest and hydration.  Please follow-up with PCP.  You can also take Tylenol  ibuprofen  for your symptoms as well.

## 2024-03-25 NOTE — ED Triage Notes (Signed)
 Patient reports sore throat onset yesterday afternoon , denies fever or chills , no cough , she adds upper back pain and headache .

## 2024-03-25 NOTE — ED Provider Notes (Signed)
 [facility] Provider Note   CSN: [id number] Arrival date & time: 03/25/24  4098     History  Chief Complaint  Patient presents with   Sore Throat   HPI Alison Stanton is a 53 y.o. female presenting for chest pain and sore throat.  Started last night.  Chest pain is in the center of her chest and radiates to her back.  It is sharp and reproducible.  Also endorses associated shortness of breath.  She also states she has a headache which feels very similar to headaches she has had in the past and is all over her head.  Denies fever.  Denies calf tenderness, OCP use and recent long trips or immobilization.  Past Medical History:  Diagnosis Date   Chronic headaches 05/28/2017   Decreased visual acuity 05/28/2017   Environmental and seasonal allergies 05/28/2017   Gallstones       Sore Throat       Home Medications Prior to Admission medications   Medication Sig Start Date End Date Taking? Authorizing Provider  benzonatate  (TESSALON ) 200 MG capsule Take 1 capsule (200 mg total) by mouth 3 (three) times daily as needed for cough. 12/04/23   Lesle Ras, MD      Allergies    Patient has no known allergies.    Review of Systems   See HPI  Physical Exam Updated Vital Signs BP 128/67 (BP Location: Right Arm)   Pulse 93   Temp 98.3 F (36.8 C) (Oral)   Resp 18   LMP 11/25/2021   SpO2 100%  Physical Exam Vitals and nursing note reviewed.  HENT:     Head: Normocephalic and atraumatic.     Mouth/Throat:     Mouth: Mucous membranes are moist.     Pharynx: Oropharynx is clear. Uvula midline. No pharyngeal swelling, oropharyngeal exudate, posterior oropharyngeal erythema or uvula swelling.  Eyes:     General:        Right eye: No discharge.        Left eye: No discharge.     Conjunctiva/sclera: Conjunctivae normal.  Cardiovascular:     Rate and Rhythm: Normal rate and regular rhythm.     Pulses: Normal pulses.     Heart  sounds: Normal heart sounds.  Pulmonary:     Effort: Pulmonary effort is normal.     Breath sounds: Normal breath sounds.  Abdominal:     General: Abdomen is flat.     Palpations: Abdomen is soft.  Skin:    General: Skin is warm and dry.  Neurological:     General: No focal deficit present.     Comments: GCS 15. Speech is goal oriented. No deficits appreciated to CN III-XII; symmetric eyebrow raise, no facial drooping, tongue midline. Patient has equal grip strength bilaterally with 5/5 strength against resistance in all major muscle groups bilaterally. Sensation to light touch intact. Patient moves extremities without ataxia. Normal finger-nose-finger. Patient ambulatory with steady gait.  Psychiatric:        Mood and Affect: Mood normal.     ED Results / Procedures / Treatments   Labs (all labs ordered are listed, but only abnormal results are displayed) Labs Reviewed  BASIC METABOLIC PANEL WITH GFR - Abnormal; Notable for the following components:      Result Value   Glucose, Bld 110 (*)    All other components within normal limits  GROUP A STREP BY PCR  CBC  BRAIN NATRIURETIC  PEPTIDE  TROPONIN I (HIGH SENSITIVITY)    EKG EKG Interpretation Date/Time:  Thursday Mar 25 2024 08:26:42 EDT Ventricular Rate:  84 PR Interval:  144 QRS Duration:  82 QT Interval:  372 QTC Calculation: 439 R Axis:   42  Text Interpretation: Normal sinus rhythm Low voltage QRS Cannot rule out Anterior infarct , age undetermined Abnormal ECG When compared with ECG of 08-Mar-2023 10:12, PREVIOUS ECG IS PRESENT when compared to prior, similar appearance No STEMI Confirmed by Wynell Heath ([id number]) on 03/25/2024 9:45:12 AM  Radiology DG Chest 1 View Result Date: 03/25/2024 CLINICAL DATA:  Chest pain EXAM: CHEST  1 VIEW COMPARISON:  03/08/2023 FINDINGS: Markedly low volume film. Cardiopericardial silhouette is at upper limits of normal for size. There is pulmonary vascular congestion without overt  pulmonary edema. Probable atelectasis or infiltrate at the lateral left base and tiny left effusion not excluded. No acute bony abnormality. IMPRESSION: 1. Markedly low volume film with pulmonary vascular congestion. 2. Probable atelectasis or infiltrate at the lateral left base and tiny left effusion not excluded. Electronically Signed   By: Donnal Fusi M.D.   On: 03/25/2024 07:29    Procedures Procedures    Medications Ordered in ED Medications  acetaminophen  (TYLENOL ) tablet 1,000 mg (1,000 mg Oral Given 03/25/24 0746)  famotidine  (PEPCID ) tablet 20 mg (20 mg Oral Given 03/25/24 0746)    ED Course/ Medical Decision Making/ A&P                                 Medical Decision Making Amount and/or Complexity of Data Reviewed Labs: ordered. Radiology: ordered.  Risk OTC drugs.   Initial Impression and Ddx 31-year-old well-appearing female presenting for sore throat and chest pain.  Exam was unremarkable.  DDx includes strep pharyngitis, peritonsillar abscess, ACS, PE, pneumonia, pneumothorax, hypertensive emergency, stroke, other. Patient PMH that increases complexity of ED encounter:  chronic headaches  Interpretation of Diagnostics - I independent reviewed and interpreted the labs as followed: mild hyperglycemia (110)  - I independently visualized the following imaging with scope of interpretation limited to determining acute life threatening conditions related to emergency care: CXR, which revealed markedly low volume film with pulmonary vascular congestion, probable atelectasis or infiltrate at the lateral left base and tiny left effusion not excluded.  - I personally reviewed and interpreted EKG which revealed normal sinus rhythm  Patient Reassessment and Ultimate Disposition/Management Remained hemodynamically stable, no acute distress and well-appearing.  Workup reassuring.  X-ray findings shared with patient and suggestive of possible CHF versus pneumonia.  Doubt CHF  given no lower extremity edema and BNP is within normal limits also troponin is negative.  Considered pneumonia but asked the patient multiple times she had a fever and cough at home to which she replied no couple with no fever and white count is normal here, making unlikely she has pneumonia.  Workup not suggestive of ACS or PE.  Suspect symptoms are related to viral illness.  Advised conservative treatment at home.  Discussed return precautions.  Advised PCP follow-up.  Discharged in good condition.  Exam findings in the throat also not suggestive of strep pharyngitis, Ludwig's angina or other deep space infection.  Patient management required discussion with the following services or consulting groups:  None  Complexity of Problems Addressed Acute complicated illness or Injury  Additional Data Reviewed and Analyzed Further history obtained from: Further history from spouse/family member, Past medical history and  medications listed in the EMR, and Prior ED visit notes  Patient Encounter Risk Assessment Consideration of hospitalization         Final Clinical Impression(s) / ED Diagnoses Final diagnoses:  Viral illness    Rx / DC Orders ED Discharge Orders     None         Janalee Mcmurray, PA-C 03/25/24 2355    Tegeler, Marine Sia, MD 03/25/24 [phone]

## 2024-03-25 NOTE — ED Notes (Signed)
 Patient declined IV after it was started, states she did not like the " needle" being left in her arm, education provided, pt still declined.  Verdia Glad, PA notified.

## 2024-04-22 ENCOUNTER — Ambulatory Visit (HOSPITAL_COMMUNITY)
Admission: EM | Admit: 2024-04-22 | Discharge: 2024-04-22 | Disposition: A | Discharge status: Home / Self Care | Attending: Internal Medicine | Admitting: Internal Medicine

## 2024-04-22 ENCOUNTER — Other Ambulatory Visit (HOSPITAL_COMMUNITY): Payer: Self-pay

## 2024-04-22 ENCOUNTER — Encounter (HOSPITAL_COMMUNITY): Payer: Self-pay

## 2024-04-22 DIAGNOSIS — M5432 Sciatica, left side: Secondary | ICD-10-CM

## 2024-04-22 DIAGNOSIS — M5431 Sciatica, right side: Secondary | ICD-10-CM | POA: Diagnosis not present

## 2024-04-22 MED ORDER — KETOROLAC TROMETHAMINE 30 MG/ML IJ SOLN
30.0000 mg | Freq: Once | INTRAMUSCULAR | Status: AC
  Administered 2024-04-22: 30 mg via INTRAMUSCULAR

## 2024-04-22 MED ORDER — DEXAMETHASONE SODIUM PHOSPHATE 10 MG/ML IJ SOLN
INTRAMUSCULAR | Status: AC
  Filled 2024-04-22: qty 1

## 2024-04-22 MED ORDER — CYCLOBENZAPRINE HCL 5 MG PO TABS
5.0000 mg | ORAL_TABLET | Freq: Three times a day (TID) | ORAL | 0 refills | Status: DC | PRN
  Filled 2024-04-22: qty 30, 10d supply, fill #0

## 2024-04-22 MED ORDER — KETOROLAC TROMETHAMINE 30 MG/ML IJ SOLN
INTRAMUSCULAR | Status: AC
Start: 2024-04-22 — End: 2024-04-22
  Filled 2024-04-22: qty 1

## 2024-04-22 MED ORDER — DEXAMETHASONE SODIUM PHOSPHATE 10 MG/ML IJ SOLN
10.0000 mg | Freq: Once | INTRAMUSCULAR | Status: AC
  Administered 2024-04-22: 10 mg via INTRAMUSCULAR

## 2024-04-22 MED ORDER — PREDNISONE 20 MG PO TABS
40.0000 mg | ORAL_TABLET | Freq: Every day | ORAL | 0 refills | Status: AC
  Filled 2024-04-22: qty 10, 5d supply, fill #0

## 2024-04-22 NOTE — Discharge Instructions (Addendum)
 Symptoms are most consistent bilateral sciatica. We have attached information about this condition in [place]. This condition is usually treated with medication for inflammation. We will treat with the following:  Toradol  injection given today. This is a medication to help with pain. This is not a narcotic.  Decadron  injection given today. This is a steroid to help with inflammation and pain.  Start this medication tomorrow 6/13: Prednisone  40 mg (2 tablets) once daily for 5 days. Take this in the morning.  This is a steroid to help with inflammation and pain. Flexeril  5 mg every 8 hours as needed for muscle spasms.  Use caution as this medication can cause drowsiness. Light stretching to help reduce stiffness and increase mobility May use a heating pad on the area for comfort Return to urgent care or PCP if symptoms worsen or fail to resolve.

## 2024-04-22 NOTE — ED Provider Notes (Signed)
 [facility]    CSN: [id number] Arrival date & time: 04/22/24  1423      History   Chief Complaint Chief Complaint  Patient presents with   Hip Pain    HPI Alison Stanton is a 53 y.o. female.   53 y.o. female who presents to urgent care with complaints of bilateral pain in the posterior hips with radiating pain down both lower extremities.  She reports this been going on for at least a month and seems to be getting worse.  She relates that it starts in her lower back with fatigue and pain with spasms at times.  This will then radiate down into her hips and down the backs and sides of her legs.  Her left leg is worse than her right.  She reports that the pain is much worse when she has been laying down and then stands to walk.  She does have a history of sciatica in the past but has not had any issues and quite some time.  She denies any numbness, tingling, weakness, bowel or bladder incontinence, fevers or chills. She does have some generalized joint pains in her elbows and knees but not severe and on-going.      Hip Pain Pertinent negatives include no chest pain, no abdominal pain and no shortness of breath.    Past Medical History:  Diagnosis Date   Chronic headaches 05/28/2017   Decreased visual acuity 05/28/2017   Environmental and seasonal allergies 05/28/2017   Gallstones     Patient Active Problem List   Diagnosis Date Noted   Decreased visual acuity 05/28/2017   Chronic headaches 05/28/2017   Environmental and seasonal allergies 05/28/2017    Past Surgical History:  Procedure Laterality Date   CHOLECYSTECTOMY  04/2016   [facility]  [place]    OB History   No obstetric history on file.      Home Medications    Prior to Admission medications   Medication Sig Start Date End Date Taking? Authorizing Provider  cyclobenzaprine  (FLEXERIL ) 5 MG tablet Take 1 tablet (5 mg total) by mouth every 8 (eight) hours as needed for muscle  spasms. 04/22/24  Yes Joshalyn Ancheta A, PA-C  predniSONE  (DELTASONE ) 20 MG tablet Take 2 tablets (40 mg total) by mouth daily with breakfast for 5 days. 04/22/24 04/27/24 Yes Sumaya Riedesel A, PA-C  benzonatate  (TESSALON ) 200 MG capsule Take 1 capsule (200 mg total) by mouth 3 (three) times daily as needed for cough. 12/04/23   Lesle Ras, MD    Family History Family History  Problem Relation Age of Onset   Hypertension Mother    Headache Father    Hypertension Father    Diabetes Sister     Social History Social History   Tobacco Use   Smoking status: Never   Smokeless tobacco: Never  Vaping Use   Vaping status: Never Used  Substance Use Topics   Alcohol use: No   Drug use: No     Allergies   Patient has no known allergies.   Review of Systems Review of Systems  Constitutional:  Negative for chills and fever.  HENT:  Negative for ear pain and sore throat.   Eyes:  Negative for pain and visual disturbance.  Respiratory:  Negative for cough and shortness of breath.   Cardiovascular:  Negative for chest pain and palpitations.  Gastrointestinal:  Negative for abdominal pain and vomiting.  Genitourinary:  Negative for dysuria and hematuria.  Musculoskeletal:  Positive  for back pain (With radiating pain to both legs). Negative for arthralgias.  Skin:  Negative for color change and rash.  Neurological:  Negative for seizures and syncope.  All other systems reviewed and are negative.    Physical Exam Triage Vital Signs ED Triage Vitals  Encounter Vitals Group     BP 04/22/24 1447 109/62     Girls Systolic BP Percentile --      Girls Diastolic BP Percentile --      Boys Systolic BP Percentile --      Boys Diastolic BP Percentile --      Pulse Rate 04/22/24 1447 73     Resp 04/22/24 1447 18     Temp 04/22/24 1447 98.3 F (36.8 C)     Temp Source 04/22/24 1447 Oral     SpO2 04/22/24 1447 97 %     Weight --      Height 04/22/24 1447 5' 3.5 (1.613 m)     Head  Circumference --      Peak Flow --      Pain Score 04/22/24 1443 4     Pain Loc --      Pain Education --      Exclude from Growth Chart --    No data found.  Updated Vital Signs BP 109/62 (BP Location: Left Arm)   Pulse 73   Temp 98.3 F (36.8 C) (Oral)   Resp 18   Ht 5' 3.5 (1.613 m)   LMP 11/25/2021   SpO2 97%   BMI 32.08 kg/m   Visual Acuity Right Eye Distance:   Left Eye Distance:   Bilateral Distance:    Right Eye Near:   Left Eye Near:    Bilateral Near:     Physical Exam Vitals and nursing note reviewed.  Constitutional:      General: She is not in acute distress.    Appearance: She is well-developed.  HENT:     Head: Normocephalic and atraumatic.   Eyes:     Conjunctiva/sclera: Conjunctivae normal.    Cardiovascular:     Rate and Rhythm: Normal rate and regular rhythm.     Heart sounds: No murmur heard. Pulmonary:     Effort: Pulmonary effort is normal. No respiratory distress.     Breath sounds: Normal breath sounds.  Abdominal:     Palpations: Abdomen is soft.     Tenderness: There is no abdominal tenderness.   Musculoskeletal:        General: No swelling.     Cervical back: Neck supple.     Lumbar back: Spasms and tenderness present. No swelling, edema or deformity. Normal range of motion. Positive right straight leg raise test and positive left straight leg raise test.       Back:     Comments: Pain with lying flat and then sitting back up in the lower back   Skin:    General: Skin is warm and dry.     Capillary Refill: Capillary refill takes less than 2 seconds.   Neurological:     General: No focal deficit present.     Mental Status: She is alert and oriented to person, place, and time.     Sensory: No sensory deficit.     Motor: No weakness.     Gait: Gait normal.   Psychiatric:        Mood and Affect: Mood normal.        Behavior: Behavior normal.  Thought Content: Thought content normal.        Judgment: Judgment  normal.      UC Treatments / Results  Labs (all labs ordered are listed, but only abnormal results are displayed) Labs Reviewed - No data to display  EKG   Radiology No results found.  Procedures Procedures (including critical care time)  Medications Ordered in UC Medications  ketorolac  (TORADOL ) 30 MG/ML injection 30 mg (30 mg Intramuscular Given 04/22/24 1534)  dexamethasone  (DECADRON ) injection 10 mg (10 mg Intramuscular Given 04/22/24 1534)    Initial Impression / Assessment and Plan / UC Course  I have reviewed the triage vital signs and the nursing notes.  Pertinent labs & imaging results that were available during my care of the patient were reviewed by me and considered in my medical decision making (see chart for details).     Bilateral sciatica   Symptoms are most consistent bilateral sciatica. We have attached information about this condition in [place].  Reassuringly, the patient is not having any other neurological deficit associated with this and has a history of sciatica. We will treat with the following:  Toradol  injection given today.  Decadron  injection given today.  Start this medication tomorrow 6/13: Prednisone  40 mg (2 tablets) once daily for 5 days. Take this in the morning.  This is a steroid to help with inflammation and pain. Flexeril  5 mg every 8 hours as needed for muscle spasms.  Use caution as this medication can cause drowsiness. Light stretching to help reduce stiffness and increase mobility May use a heating pad on the area for comfort Return to urgent care or PCP if symptoms worsen or fail to resolve.    Final Clinical Impressions(s) / UC Diagnoses   Final diagnoses:  Bilateral sciatica     Discharge Instructions      Symptoms are most consistent bilateral sciatica. We have attached information about this condition in [place]. This condition is usually treated with medication for inflammation. We will treat with the following:   Toradol  injection given today. This is a medication to help with pain. This is not a narcotic.  Decadron  injection given today. This is a steroid to help with inflammation and pain.  Start this medication tomorrow 6/13: Prednisone  40 mg (2 tablets) once daily for 5 days. Take this in the morning.  This is a steroid to help with inflammation and pain. Flexeril  5 mg every 8 hours as needed for muscle spasms.  Use caution as this medication can cause drowsiness. Light stretching to help reduce stiffness and increase mobility May use a heating pad on the area for comfort Return to urgent care or PCP if symptoms worsen or fail to resolve.       ED Prescriptions     Medication Sig Dispense Auth. Provider   predniSONE  (DELTASONE ) 20 MG tablet Take 2 tablets (40 mg total) by mouth daily with breakfast for 5 days. 10 tablet Shericka Johnstone A, PA-C   cyclobenzaprine  (FLEXERIL ) 5 MG tablet Take 1 tablet (5 mg total) by mouth every 8 (eight) hours as needed for muscle spasms. 30 tablet Kreg Pesa, [place]      PDMP not reviewed this encounter.   Kreg Pesa, [place] 04/22/24 1540

## 2024-04-22 NOTE — ED Triage Notes (Signed)
 Interpreter: [id number]  Patient presenting with pain in both hips going down into the legs onset 1 month ago. Pain is worse on the left side. No known falls or injuries, the pain was sudden onset. Only slept 2 hours last night due tot he pain. Pain worse with sitting and going to stand.  Prescriptions or OTC medications tried: Yes- Tylenol , Ibuprofen ,    with no relief

## 2024-04-23 ENCOUNTER — Other Ambulatory Visit (HOSPITAL_COMMUNITY): Payer: Self-pay

## 2024-05-24 ENCOUNTER — Ambulatory Visit (HOSPITAL_COMMUNITY): Admission: EM | Admit: 2024-05-24 | Discharge: 2024-05-24 | Disposition: A | Discharge status: Home / Self Care

## 2024-05-24 ENCOUNTER — Other Ambulatory Visit (HOSPITAL_COMMUNITY): Payer: Self-pay

## 2024-05-24 ENCOUNTER — Encounter (HOSPITAL_COMMUNITY): Payer: Self-pay | Admitting: *Deleted

## 2024-05-24 ENCOUNTER — Ambulatory Visit (HOSPITAL_COMMUNITY)

## 2024-05-24 DIAGNOSIS — M25461 Effusion, right knee: Secondary | ICD-10-CM | POA: Diagnosis not present

## 2024-05-24 DIAGNOSIS — M25561 Pain in right knee: Secondary | ICD-10-CM | POA: Diagnosis not present

## 2024-05-24 DIAGNOSIS — M25562 Pain in left knee: Secondary | ICD-10-CM | POA: Diagnosis not present

## 2024-05-24 DIAGNOSIS — M25462 Effusion, left knee: Secondary | ICD-10-CM

## 2024-05-24 MED ORDER — DEXAMETHASONE SODIUM PHOSPHATE 10 MG/ML IJ SOLN
10.0000 mg | Freq: Once | INTRAMUSCULAR | Status: AC
  Administered 2024-05-24: 10 mg via INTRAMUSCULAR

## 2024-05-24 MED ORDER — PREDNISONE 20 MG PO TABS
40.0000 mg | ORAL_TABLET | Freq: Every day | ORAL | 0 refills | Status: AC
  Filled 2024-05-24: qty 10, 5d supply, fill #0

## 2024-05-24 MED ORDER — KETOROLAC TROMETHAMINE 30 MG/ML IJ SOLN
30.0000 mg | Freq: Once | INTRAMUSCULAR | Status: AC
  Administered 2024-05-24: 30 mg via INTRAMUSCULAR

## 2024-05-24 MED ORDER — KETOROLAC TROMETHAMINE 30 MG/ML IJ SOLN
INTRAMUSCULAR | Status: AC
  Filled 2024-05-24: qty 1

## 2024-05-24 MED ORDER — DEXAMETHASONE SODIUM PHOSPHATE 10 MG/ML IJ SOLN
INTRAMUSCULAR | Status: AC
  Filled 2024-05-24: qty 1

## 2024-05-24 NOTE — Discharge Instructions (Addendum)
 Bilateral knee pain and swelling with the left being much worse than the right.  There was no specific injury to the area so acute fracture is not likely.  Symptoms could be secondary to arthritic changes or an acute inflammation.  We will treat with the following and if no improvement return for imaging: Toradol  injection given today. This is a medication to help with pain. This is not a narcotic.  Decadron  injection given today. This is a steroid to help with inflammation and pain. Starting tomorrow July 15: Prednisone  40 mg (2 tablets) once daily for 5 days. Take this in the morning.  This is a steroid to help with inflammation and pain.  Ice the area 2-3 times daily for 10-15 minutes to help with pain and swelling. Do not apply ice directly to the skin.  Use Ace wrap's on both knees to help with swelling and support.  Wear these during the day and remove at night. If symptoms do not improve in 2 to 3 days recommend returning to urgent care for x-rays of the knee

## 2024-05-24 NOTE — ED Triage Notes (Signed)
 Via [employer] video interpreter [phone]: C/O bilat knee pain (L>R) x approx 2 wks. Right knee making a sound when I walk. Pt ambulates very slowly and guarded. C/O swelling in left knee with pain radiating down into ankle. States this pain is different that the pain she was having at visit 04/22/24. States has tried IBU for pain with some short-lived relief.

## 2024-05-24 NOTE — ED Notes (Signed)
 Ice pack x2 provided to pt with instruction via video interpreter.

## 2024-05-24 NOTE — ED Provider Notes (Signed)
 [facility]    CSN: [id number] Arrival date & time: 05/24/24  1401      History   Chief Complaint Chief Complaint  Patient presents with   Knee Pain    HPI Alison Stanton is a 53 y.o. female.   53 y.o. female who presents to urgent care with complaints of bilateral knee pain and swelling with the left being much worse. Started with the left knee about 2 weeks. Has tried ibuprofen  and topical products. Can't sleep due to the pain.  She denies any history of injury or surgery to the area.  She did not have any specific injury on this episode.  The symptoms just started all of a sudden.  She denies any locking or giving out.  She does report that the pain can radiate down to her ankle especially on the left.  She has not used any compression wraps or ice.   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      Home Medications    Prior to Admission medications   Medication Sig Start Date End Date Taking? Authorizing Provider  IBUPROFEN  PO Take by mouth.   Yes [provider]  predniSONE  (DELTASONE ) 20 MG tablet Take 2 tablets (40 mg total) by mouth daily with breakfast for 5 days. 05/24/24 05/29/24 Yes Cameren Earnest A, PA-C  benzonatate  (TESSALON ) 200 MG capsule Take 1 capsule (200 mg total) by mouth 3 (three) times daily as needed for cough. 12/04/23   Piontek, Rocky, MD  cyclobenzaprine  (FLEXERIL ) 5 MG tablet Take 1 tablet (5 mg  total) by mouth every 8 (eight) hours as needed for muscle spasms. 04/22/24   Teresa Almarie LABOR, PA-C    Family History Family History  Problem Relation Age of Onset   Hypertension Mother    Headache Father    Hypertension Father    Diabetes Sister     Social History Social History   Tobacco Use   Smoking status: Never   Smokeless tobacco: Never  Vaping Use   Vaping status: Never Used  Substance Use Topics   Alcohol use: Never   Drug use: No     Allergies   Patient has no known allergies.   Review of Systems Review of Systems  Constitutional:  Negative for chills and fever.  HENT:  Negative for ear pain and sore throat.   Eyes:  Negative for pain and visual disturbance.  Respiratory:  Negative for cough and shortness of breath.   Cardiovascular:  Negative for chest pain and palpitations.  Gastrointestinal:  Negative for abdominal pain and vomiting.  Genitourinary:  Negative for dysuria and hematuria.  Musculoskeletal:  Negative for arthralgias and back pain.       Bilateral knee pain.  Skin:  Negative for color change and rash.  Neurological:  Negative for seizures and syncope.  All other systems reviewed and are negative.    Physical Exam Triage Vital  Signs ED Triage Vitals  Encounter Vitals Group     BP 05/24/24 1436 113/73     Girls Systolic BP Percentile --      Girls Diastolic BP Percentile --      Boys Systolic BP Percentile --      Boys Diastolic BP Percentile --      Pulse Rate 05/24/24 1436 80     Resp 05/24/24 1436 18     Temp 05/24/24 1436 97.7 F (36.5 C)     Temp Source 05/24/24 1436 Oral     SpO2 05/24/24 1436 96 %     Weight --      Height --      Head Circumference --      Peak Flow --      Pain Score 05/24/24 1437 9     Pain Loc --      Pain Education --      Exclude from Growth Chart --    No data found.  Updated Vital Signs BP 113/73   Pulse 80   Temp 97.7 F (36.5 C) (Oral)   Resp 18   LMP 11/25/2021   SpO2 96%    Visual Acuity Right Eye Distance:   Left Eye Distance:   Bilateral Distance:    Right Eye Near:   Left Eye Near:    Bilateral Near:     Physical Exam Vitals and nursing note reviewed.  Constitutional:      General: She is not in acute distress.    Appearance: She is well-developed.  HENT:     Head: Normocephalic and atraumatic.  Eyes:     Conjunctiva/sclera: Conjunctivae normal.  Cardiovascular:     Rate and Rhythm: Normal rate and regular rhythm.     Heart sounds: No murmur heard. Pulmonary:     Effort: Pulmonary effort is normal. No respiratory distress.     Breath sounds: Normal breath sounds.  Abdominal:     Palpations: Abdomen is soft.     Tenderness: There is no abdominal tenderness.  Musculoskeletal:        General: No swelling.     Cervical back: Neck supple.     Right knee: Decreased range of motion. Tenderness present over the medial joint line and patellar tendon. No lateral joint line tenderness. Normal pulse.     Instability Tests: Anterior drawer test negative. Posterior drawer test negative. Medial McMurray test negative and lateral McMurray test negative.     Left knee: Swelling present. No deformity. Decreased range of motion. Tenderness present over the medial joint line, lateral joint line and patellar tendon. Normal pulse.     Instability Tests: Anterior drawer test negative. Posterior drawer test negative. Medial McMurray test positive and lateral McMurray test positive.     Comments: Knee Swelling on left >>>right  Skin:    General: Skin is warm and dry.     Capillary Refill: Capillary refill takes less than 2 seconds.  Neurological:     Mental Status: She is alert.  Psychiatric:        Mood and Affect: Mood normal.      UC Treatments / Results  Labs (all labs ordered are listed, but only abnormal results are displayed) Labs Reviewed - No data to display  EKG   Radiology No results found.  Procedures Procedures (including critical  care time)  Medications Ordered in UC Medications  ketorolac  (TORADOL ) 30 MG/ML injection 30 mg (has no administration in time range)  dexamethasone  (DECADRON )  injection 10 mg (has no administration in time range)    Initial Impression / Assessment and Plan / UC Course  I have reviewed the triage vital signs and the nursing notes.  Pertinent labs & imaging results that were available during my care of the patient were reviewed by me and considered in my medical decision making (see chart for details).     Acute pain of both knees - Plan: DG Knee Complete 4 Views Right, DG Knee Complete 4 Views Left, DG Knee Complete 4 Views Right, DG Knee Complete 4 Views Left  Swelling of both knees   Bilateral knee pain and swelling with the left being much worse than the right.  There was no specific injury to the area so acute fracture is not likely.  Symptoms could be secondary to arthritic changes or an acute inflammation.  We will treat with the following and if no improvement return for imaging: Toradol  injection given today. This is a medication to help with pain. This is not a narcotic.  Decadron  injection given today. This is a steroid to help with inflammation and pain. Starting tomorrow July 15: Prednisone  40 mg (2 tablets) once daily for 5 days. Take this in the morning.  This is a steroid to help with inflammation and pain.  Ice the area 2-3 times daily for 10-15 minutes to help with pain and swelling. Do not apply ice directly to the skin.  Use Ace wrap's on both knees to help with swelling and support.  Wear these during the day and remove at night. If symptoms do not improve in 2 to 3 days recommend returning to urgent care for x-rays of the knee  Final Clinical Impressions(s) / UC Diagnoses   Final diagnoses:  Acute pain of both knees  Swelling of both knees     Discharge Instructions      Bilateral knee pain and swelling with the left being much worse than the right.  There  was no specific injury to the area so acute fracture is not likely.  Symptoms could be secondary to arthritic changes or an acute inflammation.  We will treat with the following and if no improvement return for imaging: Toradol  injection given today. This is a medication to help with pain. This is not a narcotic.  Decadron  injection given today. This is a steroid to help with inflammation and pain. Starting tomorrow July 15: Prednisone  40 mg (2 tablets) once daily for 5 days. Take this in the morning.  This is a steroid to help with inflammation and pain.  Ice the area 2-3 times daily for 10-15 minutes to help with pain and swelling. Do not apply ice directly to the skin.  Use Ace wrap's on both knees to help with swelling and support.  Wear these during the day and remove at night. If symptoms do not improve in 2 to 3 days recommend returning to urgent care for x-rays of the knee     ED Prescriptions     Medication Sig Dispense Auth. Provider   predniSONE  (DELTASONE ) 20 MG tablet Take 2 tablets (40 mg total) by mouth daily with breakfast for 5 days. 10 tablet Teresa Almarie LABOR, [place]      PDMP not reviewed this encounter.   Teresa Almarie LABOR, PA-C 05/24/24 1552

## 2024-06-14 ENCOUNTER — Ambulatory Visit (HOSPITAL_COMMUNITY)
Admission: EM | Admit: 2024-06-14 | Discharge: 2024-06-14 | Disposition: A | Discharge status: Home / Self Care | Attending: Family Medicine | Admitting: Family Medicine

## 2024-06-14 ENCOUNTER — Other Ambulatory Visit: Payer: Self-pay

## 2024-06-14 ENCOUNTER — Encounter (HOSPITAL_COMMUNITY): Payer: Self-pay | Admitting: *Deleted

## 2024-06-14 ENCOUNTER — Other Ambulatory Visit (HOSPITAL_COMMUNITY): Payer: Self-pay

## 2024-06-14 DIAGNOSIS — J069 Acute upper respiratory infection, unspecified: Secondary | ICD-10-CM | POA: Insufficient documentation

## 2024-06-14 DIAGNOSIS — R070 Pain in throat: Secondary | ICD-10-CM | POA: Insufficient documentation

## 2024-06-14 LAB — POC SARS CORONAVIRUS 2 AG -  ED: SARS Coronavirus 2 Ag: NEGATIVE

## 2024-06-14 LAB — POCT RAPID STREP A (OFFICE): Rapid Strep A Screen: NEGATIVE

## 2024-06-14 MED ORDER — IBUPROFEN 800 MG PO TABS
800.0000 mg | ORAL_TABLET | Freq: Three times a day (TID) | ORAL | 0 refills | Status: DC | PRN
  Filled 2024-06-14: qty 21, 7d supply, fill #0

## 2024-06-14 NOTE — Discharge Instructions (Addendum)
 Your strep test is negative.  Culture of the throat will be sent, and staff will notify you if that is in turn positive.  The COVID test was also negative.  Take ibuprofen  800 mg--1 tab every 8 hours as needed for pain.  Make sure you are drinking plenty of fluids  Please follow-up with your primary care  (Votre test de dpistage du streptocoque est ngatif. Un prlvement de la gorge sera effectu et Solicitor vous informera si celui-ci est positif.  Le test COVID est galement ngatif.  Prendre de l'ibuprofne 800 mg (1 comprim toutes les 8 heures) au besoin pour Financial planner.  Assurez-vous de boire suffisamment.  Veuillez consulter votre mdecin traitant.)

## 2024-06-14 NOTE — ED Triage Notes (Signed)
 PT reports she has had a fever since SAt. Pt has CP that is worse when she bends over. Pt reports the CP goes up to her head and down to lower part of body. Pt also has some heat in her throat. Pt  IBU 800mg  for pain SAt and Sun.

## 2024-06-14 NOTE — ED Provider Notes (Signed)
 [facility]    CSN: [id number] Arrival date & time: 06/14/24  1101      History   Chief Complaint Chief Complaint  Patient presents with   Fever    HPI Alison Stanton is a 53 y.o. female.    Fever Here for subjective fever and myalgia.  Fever and chills began on August 2.  She notes pain in her chest and upper back and throat and neck.  It does not sound like it hurts to swallow.  The pain she has worsen when she moves.  She does have some heat in her throat.  She is taken ibuprofen  for the pain and it did help some.  She states that sometimes when she takes a deep breath her breath stops.  No cough or nasal congestion.  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      Home Medications    Prior to Admission medications   Medication Sig Start Date End Date Taking? Authorizing Provider  ibuprofen  (ADVIL ) 800 MG tablet Take 1 tablet (800 mg total) by mouth every 8 (eight) hours as needed (pain). 06/14/24  Yes Vonna Sharlet POUR, MD    Family History Family History  Problem Relation Age of Onset   Hypertension Mother    Headache Father    Hypertension Father    Diabetes Sister     Social History Social History   Tobacco Use   Smoking status: Never   Smokeless tobacco: Never  Vaping Use   Vaping status: Never Used   Substance Use Topics   Alcohol use: Never   Drug use: No     Allergies   Patient has no known allergies.   Review of Systems Review of Systems  Constitutional:  Positive for fever.     Physical Exam Triage Vital Signs ED Triage Vitals  Encounter Vitals Group     BP 06/14/24 1249 119/84     Girls Systolic BP Percentile --      Girls Diastolic BP Percentile --      Boys Systolic BP Percentile --      Boys Diastolic BP Percentile --      Pulse Rate 06/14/24 1249 79     Resp 06/14/24 1249 20     Temp 06/14/24 1249 98.1 F (36.7 C)     Temp src --      SpO2 06/14/24 1249 95 %     Weight --      Height --      Head Circumference --      Peak Flow --      Pain Score 06/14/24 1245 9     Pain Loc --      Pain Education --      Exclude from Growth Chart --    No data found.  Updated  Vital Signs BP 119/84   Pulse 79   Temp 98.1 F (36.7 C)   Resp 20   LMP 11/25/2021   SpO2 95%   Visual Acuity Right Eye Distance:   Left Eye Distance:   Bilateral Distance:    Right Eye Near:   Left Eye Near:    Bilateral Near:     Physical Exam Vitals reviewed.  Constitutional:      General: She is not in acute distress.    Appearance: She is not toxic-appearing.  HENT:     Nose: Nose normal.     Mouth/Throat:     Mouth: Mucous membranes are moist.     Pharynx: No oropharyngeal exudate or posterior oropharyngeal erythema.  Eyes:     Extraocular Movements: Extraocular movements intact.     Conjunctiva/sclera: Conjunctivae normal.     Pupils: Pupils are equal, round, and reactive to light.  Cardiovascular:     Rate and Rhythm: Normal rate and regular rhythm.     Heart sounds: No murmur heard. Pulmonary:     Effort: Pulmonary effort is normal. No respiratory distress.     Breath sounds: No stridor. No wheezing, rhonchi or rales.     Comments: Lungs are clear and she moves air well.  Anterior upper chest is tender in produces the pain that she is  describing. Musculoskeletal:     Cervical back: Neck supple.  Lymphadenopathy:     Cervical: No cervical adenopathy.  Skin:    Capillary Refill: Capillary refill takes less than 2 seconds.     Coloration: Skin is not jaundiced or pale.     Findings: No rash.  Neurological:     General: No focal deficit present.     Mental Status: She is alert and oriented to person, place, and time.  Psychiatric:        Behavior: Behavior normal.      UC Treatments / Results  Labs (all labs ordered are listed, but only abnormal results are displayed) Labs Reviewed  CULTURE, GROUP A STREP ([facility])  POC SARS CORONAVIRUS 2 AG -  ED  POCT RAPID STREP A (OFFICE)    EKG   Radiology No results found.  Procedures Procedures (including critical care time)  Medications Ordered in UC Medications - No data to display  Initial Impression / Assessment and Plan / UC Course  I have reviewed the triage vital signs and the nursing notes.  Pertinent labs & imaging results that were available during my care of the patient were reviewed by me and considered in my medical decision making (see chart for details).     Rapid strep is negative.  Throat culture is sent and we will notify and treat protocol if that is positive COVID test is also done, though initially she did not understand why she would need 1 when 2 months ago she had a negative COVID test.  I did explain that this would be a new illness and it is reasonable to test.   Her COVID test is actually negative.    Ibuprofen  800 mg is sent in for pain.  Her last EGFR was greater than 60  Visit is conducted with help with the video interpreter in her native language of [place].   Final Clinical Impressions(s) / UC Diagnoses   Final diagnoses:  Throat pain  Viral URI     Discharge Instructions      Your strep test is negative.  Culture of the throat will be sent, and  staff will notify you if that is in turn positive.  The COVID test  was also negative.  Take ibuprofen  800 mg--1 tab every 8 hours as needed for pain.  Make sure you are drinking plenty of fluids  Please follow-up with your primary care  (Votre test de dpistage du streptocoque est ngatif. Un prlvement de la gorge sera effectu et Solicitor vous informera si celui-ci est positif.  Le test COVID est galement ngatif.  Prendre de l'ibuprofne 800 mg (1 comprim toutes les 8 heures) au besoin pour Financial planner.  Assurez-vous de boire suffisamment.  Veuillez consulter votre mdecin traitant.)      ED Prescriptions     Medication Sig Dispense Auth. Provider   ibuprofen  (ADVIL ) 800 MG tablet Take 1 tablet (800 mg total) by mouth every 8 (eight) hours as needed (pain). 21 tablet Rosanna Bickle K, MD      PDMP not reviewed this encounter.   Vonna Sharlet POUR, MD 06/14/24 1351

## 2024-06-17 ENCOUNTER — Other Ambulatory Visit: Payer: Self-pay

## 2024-06-17 ENCOUNTER — Emergency Department (HOSPITAL_BASED_OUTPATIENT_CLINIC_OR_DEPARTMENT_OTHER)

## 2024-06-17 ENCOUNTER — Ambulatory Visit: Payer: Self-pay

## 2024-06-17 ENCOUNTER — Emergency Department (HOSPITAL_BASED_OUTPATIENT_CLINIC_OR_DEPARTMENT_OTHER)
Admission: EM | Admit: 2024-06-17 | Discharge: 2024-06-18 | Disposition: A | Discharge status: Home / Self Care | Attending: Emergency Medicine | Admitting: Emergency Medicine

## 2024-06-17 DIAGNOSIS — R1084 Generalized abdominal pain: Secondary | ICD-10-CM | POA: Insufficient documentation

## 2024-06-17 DIAGNOSIS — B349 Viral infection, unspecified: Secondary | ICD-10-CM | POA: Insufficient documentation

## 2024-06-17 DIAGNOSIS — R070 Pain in throat: Secondary | ICD-10-CM | POA: Diagnosis present

## 2024-06-17 DIAGNOSIS — I300 Acute nonspecific idiopathic pericarditis: Secondary | ICD-10-CM | POA: Insufficient documentation

## 2024-06-17 LAB — CBC WITH DIFFERENTIAL/PLATELET
Abs Immature Granulocytes: 0.02 K/uL (ref 0.00–0.07)
Basophils Absolute: 0 K/uL (ref 0.0–0.1)
Basophils Relative: 0 %
Eosinophils Absolute: 0 K/uL (ref 0.0–0.5)
Eosinophils Relative: 0 %
HCT: 35.2 % — ABNORMAL LOW (ref 36.0–46.0)
Hemoglobin: 11.5 g/dL — ABNORMAL LOW (ref 12.0–15.0)
Immature Granulocytes: 0 %
Lymphocytes Relative: 20 %
Lymphs Abs: 1.5 K/uL (ref 0.7–4.0)
MCH: 29 pg (ref 26.0–34.0)
MCHC: 32.7 g/dL (ref 30.0–36.0)
MCV: 88.9 fL (ref 80.0–100.0)
Monocytes Absolute: 0.5 K/uL (ref 0.1–1.0)
Monocytes Relative: 7 %
Neutro Abs: 5.7 K/uL (ref 1.7–7.7)
Neutrophils Relative %: 73 %
Platelets: 257 K/uL (ref 150–400)
RBC: 3.96 MIL/uL (ref 3.87–5.11)
RDW: 12.8 % (ref 11.5–15.5)
WBC: 7.8 K/uL (ref 4.0–10.5)
nRBC: 0 % (ref 0.0–0.2)

## 2024-06-17 LAB — COMPREHENSIVE METABOLIC PANEL WITH GFR
ALT: 32 U/L (ref 0–44)
AST: 42 U/L — ABNORMAL HIGH (ref 15–41)
Albumin: 4.1 g/dL (ref 3.5–5.0)
Alkaline Phosphatase: 86 U/L (ref 38–126)
Anion gap: 12 (ref 5–15)
BUN: 9 mg/dL (ref 6–20)
CO2: 24 mmol/L (ref 22–32)
Calcium: 9.9 mg/dL (ref 8.9–10.3)
Chloride: 101 mmol/L (ref 98–111)
Creatinine, Ser: 1 mg/dL (ref 0.44–1.00)
GFR, Estimated: >60 mL/min (ref >60)
Glucose, Bld: 125 mg/dL — ABNORMAL HIGH (ref 70–99)
Potassium: 3.4 mmol/L — ABNORMAL LOW (ref 3.5–5.1)
Sodium: 136 mmol/L (ref 135–145)
Total Bilirubin: 0.3 mg/dL (ref 0.0–1.2)
Total Protein: 7.4 g/dL (ref 6.5–8.1)

## 2024-06-17 LAB — URINALYSIS, ROUTINE W REFLEX MICROSCOPIC
Bacteria, UA: NONE SEEN
Bilirubin Urine: NEGATIVE
Glucose, UA: NEGATIVE mg/dL
Hgb urine dipstick: NEGATIVE
Ketones, ur: NEGATIVE mg/dL
Leukocytes,Ua: NEGATIVE
Nitrite: NEGATIVE
Protein, ur: 30 mg/dL — AB
Specific Gravity, Urine: >1.046 — ABNORMAL HIGH (ref 1.005–1.030)
pH: 6 (ref 5.0–8.0)

## 2024-06-17 LAB — CULTURE, GROUP A STREP (THRC)

## 2024-06-17 LAB — RESP PANEL BY RT-PCR (RSV, FLU A&B, COVID)  RVPGX2
Influenza A by PCR: NEGATIVE
Influenza B by PCR: NEGATIVE
Resp Syncytial Virus by PCR: NEGATIVE
SARS Coronavirus 2 by RT PCR: NEGATIVE

## 2024-06-17 LAB — TROPONIN T, HIGH SENSITIVITY: Troponin T High Sensitivity: <15 ng/L (ref <19)

## 2024-06-17 LAB — LIPASE, BLOOD: Lipase: 19 U/L (ref 11–51)

## 2024-06-17 MED ORDER — ONDANSETRON HCL 4 MG/2ML IJ SOLN
4.0000 mg | Freq: Once | INTRAMUSCULAR | Status: AC
  Administered 2024-06-17: 4 mg via INTRAVENOUS
  Filled 2024-06-17: qty 2

## 2024-06-17 MED ORDER — COLCHICINE 0.6 MG PO TABS
0.6000 mg | ORAL_TABLET | Freq: Once | ORAL | Status: AC
  Administered 2024-06-17: 0.6 mg via ORAL
  Filled 2024-06-17: qty 1

## 2024-06-17 MED ORDER — COLCHICINE 0.6 MG PO TABS
0.6000 mg | ORAL_TABLET | Freq: Two times a day (BID) | ORAL | 0 refills | Status: DC
  Filled 2024-06-17: qty 60, 30d supply, fill #0

## 2024-06-17 MED ORDER — FENTANYL CITRATE PF 50 MCG/ML IJ SOSY
50.0000 ug | PREFILLED_SYRINGE | Freq: Once | INTRAMUSCULAR | Status: AC
  Administered 2024-06-17: 50 ug via INTRAVENOUS
  Filled 2024-06-17: qty 1

## 2024-06-17 MED ORDER — IBUPROFEN 600 MG PO TABS
600.0000 mg | ORAL_TABLET | Freq: Four times a day (QID) | ORAL | 0 refills | Status: AC
  Filled 2024-06-17: qty 120, 30d supply, fill #0

## 2024-06-17 MED ORDER — IBUPROFEN 400 MG PO TABS
600.0000 mg | ORAL_TABLET | Freq: Once | ORAL | Status: AC
  Administered 2024-06-17: 600 mg via ORAL
  Filled 2024-06-17: qty 1

## 2024-06-17 MED ORDER — IOHEXOL 300 MG/ML  SOLN
100.0000 mL | Freq: Once | INTRAMUSCULAR | Status: AC | PRN
  Administered 2024-06-17: 100 mL via INTRAVENOUS

## 2024-06-17 NOTE — ED Notes (Signed)
 Patient transported to CT

## 2024-06-17 NOTE — ED Provider Notes (Signed)
 [place] EMERGENCY DEPARTMENT AT [facility] Provider Note   CSN: [id number] Arrival date & time: 06/17/24  2009     Patient presents with: No chief complaint on file.   Alison Stanton is a 53 y.o. female who presents to the ED today with concerns of pain in the throat/sternum with painful swallowing, however states that she also has sharp pain in the left lower chest that radiates into the neck.  Treated for an manage for viral URI on 4 August.  Symptoms have worsened since that TIME.   HPI     Prior to Admission medications   Medication Sig Start Date End Date Taking? Authorizing Provider  colchicine  0.6 MG tablet Take 1 tablet (0.6 mg total) by mouth 2 (two) times daily. 06/17/24  Yes Myriam Dorn BROCKS, PA  ibuprofen  (ADVIL ) 600 MG tablet Take 1 tablet (600 mg total) by mouth 4 (four) times daily. 06/17/24 07/17/24 Yes Myriam Dorn BROCKS, PA  ibuprofen  (ADVIL ) 800 MG tablet Take 1 tablet (800 mg total) by mouth every 8 (eight) hours as needed (pain). 06/14/24   Banister, Pamela K, MD    Allergies: Patient has no known allergies.    Review of Systems  Constitutional:  Positive for activity change, appetite change and fatigue.  Respiratory:  Positive for cough.   Cardiovascular:  Positive for chest pain.  Gastrointestinal:  Positive for nausea.  All other systems reviewed and are negative.   Updated Vital Signs BP 123/81   Pulse 96   Temp 99.3 F (37.4 C) (Oral)   Resp (!) 35   LMP 11/25/2021   SpO2 92%   Physical Exam Vitals and nursing note reviewed.  Constitutional:      General: She is not in acute distress.    Appearance: Normal appearance.  HENT:     Head: Normocephalic and atraumatic.     Mouth/Throat:     Lips: Pink.     Mouth: Mucous membranes are moist.     Pharynx: Oropharynx is clear. Uvula midline.     Tonsils: No tonsillar exudate or tonsillar abscesses.  Eyes:     General: Lids are normal. Vision grossly intact. Gaze aligned  appropriately.     Extraocular Movements: Extraocular movements intact.     Conjunctiva/sclera: Conjunctivae normal.     Pupils: Pupils are equal, round, and reactive to light.  Cardiovascular:     Rate and Rhythm: Normal rate and regular rhythm.     Pulses: Normal pulses.     Heart sounds: Normal heart sounds, S1 normal and S2 normal. No murmur heard.    No friction rub. No gallop.  Pulmonary:     Effort: Pulmonary effort is normal.     Breath sounds: Normal breath sounds.  Abdominal:     General: Abdomen is flat. Bowel sounds are normal.     Palpations: Abdomen is soft.     Tenderness: There is generalized abdominal tenderness.     Comments: Mild generalized tenderness appreciated.  Musculoskeletal:        General: Normal range of motion.     Cervical back: Normal range of motion and neck supple.     Right lower leg: No edema.     Left lower leg: No edema.  Skin:    General: Skin is warm and dry.     Capillary Refill: Capillary refill takes less than 2 seconds.  Neurological:     General: No focal deficit present.     Mental Status: She is  alert. Mental status is at baseline.  Psychiatric:        Mood and Affect: Mood normal.     (all labs ordered are listed, but only abnormal results are displayed) Labs Reviewed  COMPREHENSIVE METABOLIC PANEL WITH GFR - Abnormal; Notable for the following components:      Result Value   Potassium 3.4 (*)    Glucose, Bld 125 (*)    AST 42 (*)    All other components within normal limits  CBC WITH DIFFERENTIAL/PLATELET - Abnormal; Notable for the following components:   Hemoglobin 11.5 (*)    HCT 35.2 (*)    All other components within normal limits  URINALYSIS, ROUTINE W REFLEX MICROSCOPIC - Abnormal; Notable for the following components:   Specific Gravity, Urine >1.046 (*)    Protein, ur 30 (*)    All other components within normal limits  RESP PANEL BY RT-PCR (RSV, FLU A&B, COVID)  RVPGX2  LIPASE, BLOOD  TROPONIN T, HIGH  SENSITIVITY    EKG: EKG Interpretation Date/Time:  Thursday June 17 2024 20:31:17 EDT Ventricular Rate:  101 PR Interval:  134 QRS Duration:  80 QT Interval:  380 QTC Calculation: 493 R Axis:   47  Text Interpretation: Sinus tachycardia Probable left atrial enlargement B Confirmed by Ruthe Cornet [id number]) on 06/17/2024 8:44:31 PM  Radiology: CT CHEST ABDOMEN PELVIS W CONTRAST Result Date: 06/17/2024 CLINICAL DATA:  Abdominal pain, acute, nonlocalized Pain in throat/sternum. Painful to swallow. Painful to breath. Started Monday. Only HX mentioned is gastritis. [place] interpreter used. EXAM: CT CHEST, ABDOMEN, AND PELVIS WITH CONTRAST TECHNIQUE: Multidetector CT imaging of the chest, abdomen and pelvis was performed following the standard protocol during bolus administration of intravenous contrast. RADIATION DOSE REDUCTION: This exam was performed according to the departmental dose-optimization program which includes automated exposure control, adjustment of the mA and/or kV according to patient size and/or use of iterative reconstruction technique. CONTRAST:  OMNIPAQUE  IOHEXOL  300 MG/ML  SOLN COMPARISON:  CT angio chest 03/08/2023 FINDINGS: CT CHEST FINDINGS Cardiovascular: Normal heart size. Small pericardial effusion. The thoracic aorta is normal in caliber. No atherosclerotic plaque of the thoracic aorta. No coronary artery calcifications. Mediastinum/Nodes: No enlarged mediastinal, hilar, or axillary lymph nodes. Thyroid gland, trachea, and esophagus demonstrate no significant findings. Lungs/Pleura: Low lung volumes. Bilateral lower lobe atelectasis. No focal consolidation. No pulmonary nodule. No pulmonary mass. No pleural effusion. No pneumothorax. Musculoskeletal: No chest wall abnormality. No suspicious lytic or blastic osseous lesions. No acute displaced fracture. CT ABDOMEN PELVIS FINDINGS Hepatobiliary: No focal liver abnormality. Status post cholecystectomy. No biliary dilatation.  Pancreas: No focal lesion. Normal pancreatic contour. No surrounding inflammatory changes. No main pancreatic ductal dilatation. Spleen: Normal in size without focal abnormality. Adrenals/Urinary Tract: No adrenal nodule bilaterally. Bilateral kidneys enhance symmetrically. No hydronephrosis. No hydroureter. The urinary bladder is unremarkable. On delayed imaging, there is no urothelial wall thickening and there are no filling defects in the opacified portions of the bilateral collecting systems or ureters. Stomach/Bowel: Stomach is within normal limits. No evidence of bowel wall thickening or dilatation. Appendix appears normal. Vascular/Lymphatic: No abdominal aorta or iliac aneurysm. Mild atherosclerotic plaque of the aorta and its branches. No abdominal, pelvic, or inguinal lymphadenopathy. Reproductive: Uterus and bilateral adnexa are unremarkable. Other: No intraperitoneal free fluid. No intraperitoneal free gas. No organized fluid collection. Musculoskeletal: No abdominal wall hernia or abnormality. No suspicious lytic or blastic osseous lesions. No acute displaced fracture. IMPRESSION: 1. Small pericardial effusion. 2. No acute intrathoracic, intra-abdominal,  intrapelvic abnormality. Electronically Signed   By: Morgane  Naveau M.D.   On: 06/17/2024 22:03   DG Chest Portable 1 View Result Date: 06/17/2024 CLINICAL DATA:  Chest/upper abdominal pain Pain in throat/sternum. Painful to swallow. Painful to breath. Started Monday. Only HX mentioned is gastritis EXAM: PORTABLE CHEST 1 VIEW COMPARISON:  Chest x-ray 03/25/2024 FINDINGS: Patient is rotated. The heart and mediastinal contours are within normal limits. Low lung volumes. Retrocardiac airspace opacity. No pulmonary edema. Blunting of left costophrenic angle. No right pleural effusion. No pneumothorax. No acute osseous abnormality. IMPRESSION: Low lung volumes with retrocardiac airspace opacity and blunting of the left costophrenic angle. Possible  combination of pulmonary disease and pleural effusion. Patient is rotated limiting evaluation. Recommend repeat PA and lateral view the chest. Electronically Signed   By: Morgane  Naveau M.D.   On: 06/17/2024 21:06     Procedures   Medications Ordered in the ED  fentaNYL  (SUBLIMAZE ) injection 50 mcg (50 mcg Intravenous Given 06/17/24 2052)  ondansetron  (ZOFRAN ) injection 4 mg (4 mg Intravenous Given 06/17/24 2052)  iohexol  (OMNIPAQUE ) 300 MG/ML solution 100 mL (100 mLs Intravenous Contrast Given 06/17/24 2114)  ibuprofen  (ADVIL ) tablet 600 mg (600 mg Oral Given 06/17/24 2356)  colchicine  tablet 0.6 mg (0.6 mg Oral Given 06/17/24 2356)                                    Medical Decision Making Amount and/or Complexity of Data Reviewed Labs: ordered. Radiology: ordered.  Risk Prescription drug management.   Medical Decision Making:   Alison Stanton is a 53 y.o. female who presented to the ED today with left lower chest pain that radiates up into the upper chest/throat.  Detailed above.     Complete initial physical exam performed, notably the patient  was alert and oriented in no apparent distress though visibly uncomfortable.  Notable physical exam findings that she is tachypneic, however has normal respiratory effort..    Reviewed and confirmed nursing documentation for past medical history, family history, social history.    Initial Assessment:   With the patient's presentation of chest discomfort, consider differential of gastritis, ACS, pulmonary embolus, pancreatitis, hepatobiliary disorders, pericarditis/myocarditis.   Initial Plan:  Obtain CT imaging of the abdomen pelvis to evaluate for acute intra-abdominal pathology Screening labs including CBC and Metabolic panel to evaluate for infectious or metabolic etiology of disease.  Urinalysis with reflex culture ordered to evaluate for UTI or relevant urologic/nephrologic pathology.  CXR to evaluate for structural/infectious  intrathoracic pathology.  EKG and serial troponin to evaluate for cardiac pathology. Objective evaluation as below reviewed   Initial Study Results:   Laboratory  All laboratory results reviewed without evidence of clinically relevant pathology.   Exceptions include: None  EKG EKG was reviewed independently. Rate, rhythm, axis, intervals all examined and without medically relevant abnormality. ST segments without concerns for elevations.    Radiology:  All images reviewed independently. Agree with radiology report at this time.   CT CHEST ABDOMEN PELVIS W CONTRAST Result Date: 06/17/2024 CLINICAL DATA:  Abdominal pain, acute, nonlocalized Pain in throat/sternum. Painful to swallow. Painful to breath. Started Monday. Only HX mentioned is gastritis. [place] interpreter used. EXAM: CT CHEST, ABDOMEN, AND PELVIS WITH CONTRAST TECHNIQUE: Multidetector CT imaging of the chest, abdomen and pelvis was performed following the standard protocol during bolus administration of intravenous contrast. RADIATION DOSE REDUCTION: This exam was performed according to the  departmental dose-optimization program which includes automated exposure control, adjustment of the mA and/or kV according to patient size and/or use of iterative reconstruction technique. CONTRAST:  OMNIPAQUE  IOHEXOL  300 MG/ML  SOLN COMPARISON:  CT angio chest 03/08/2023 FINDINGS: CT CHEST FINDINGS Cardiovascular: Normal heart size. Small pericardial effusion. The thoracic aorta is normal in caliber. No atherosclerotic plaque of the thoracic aorta. No coronary artery calcifications. Mediastinum/Nodes: No enlarged mediastinal, hilar, or axillary lymph nodes. Thyroid gland, trachea, and esophagus demonstrate no significant findings. Lungs/Pleura: Low lung volumes. Bilateral lower lobe atelectasis. No focal consolidation. No pulmonary nodule. No pulmonary mass. No pleural effusion. No pneumothorax. Musculoskeletal: No chest wall abnormality. No  suspicious lytic or blastic osseous lesions. No acute displaced fracture. CT ABDOMEN PELVIS FINDINGS Hepatobiliary: No focal liver abnormality. Status post cholecystectomy. No biliary dilatation. Pancreas: No focal lesion. Normal pancreatic contour. No surrounding inflammatory changes. No main pancreatic ductal dilatation. Spleen: Normal in size without focal abnormality. Adrenals/Urinary Tract: No adrenal nodule bilaterally. Bilateral kidneys enhance symmetrically. No hydronephrosis. No hydroureter. The urinary bladder is unremarkable. On delayed imaging, there is no urothelial wall thickening and there are no filling defects in the opacified portions of the bilateral collecting systems or ureters. Stomach/Bowel: Stomach is within normal limits. No evidence of bowel wall thickening or dilatation. Appendix appears normal. Vascular/Lymphatic: No abdominal aorta or iliac aneurysm. Mild atherosclerotic plaque of the aorta and its branches. No abdominal, pelvic, or inguinal lymphadenopathy. Reproductive: Uterus and bilateral adnexa are unremarkable. Other: No intraperitoneal free fluid. No intraperitoneal free gas. No organized fluid collection. Musculoskeletal: No abdominal wall hernia or abnormality. No suspicious lytic or blastic osseous lesions. No acute displaced fracture. IMPRESSION: 1. Small pericardial effusion. 2. No acute intrathoracic, intra-abdominal, intrapelvic abnormality. Electronically Signed   By: Morgane  Naveau M.D.   On: 06/17/2024 22:03   DG Chest Portable 1 View Result Date: 06/17/2024 CLINICAL DATA:  Chest/upper abdominal pain Pain in throat/sternum. Painful to swallow. Painful to breath. Started Monday. Only HX mentioned is gastritis EXAM: PORTABLE CHEST 1 VIEW COMPARISON:  Chest x-ray 03/25/2024 FINDINGS: Patient is rotated. The heart and mediastinal contours are within normal limits. Low lung volumes. Retrocardiac airspace opacity. No pulmonary edema. Blunting of left costophrenic angle. No  right pleural effusion. No pneumothorax. No acute osseous abnormality. IMPRESSION: Low lung volumes with retrocardiac airspace opacity and blunting of the left costophrenic angle. Possible combination of pulmonary disease and pleural effusion. Patient is rotated limiting evaluation. Recommend repeat PA and lateral view the chest. Electronically Signed   By: Morgane  Naveau M.D.   On: 06/17/2024 21:06      Consults: Case discussed with cardiology.   Reassessment and Plan:   Discussed case with cardiology secondary to findings of pericardial effusion on the exam and exam findings of lower chest pain.  Agree that at this time likely is some mild pericarditis despite no specific findings on the EKG.  Will begin treatment with colchicine  and ibuprofen  as noted, follow-up with cardiology for echocardiogram, referrals given.  Discussed with the patient, they understand and agree with the care plan at this time if no further concerns.  Troponin did not show any elevations over 2 hours, respiratory panel was completed negative, remainder of lab workup is unremarkable.       Final diagnoses:  Acute viral syndrome  Acute idiopathic pericarditis    ED Discharge Orders          Ordered    colchicine  0.6 MG tablet  2 times daily  06/17/24 2346    ibuprofen  (ADVIL ) 600 MG tablet  4 times daily        06/17/24 2346               Myriam Dorn BROCKS, [place] 06/18/24 0015    Ruthe Cornet, DO 06/21/24 1315

## 2024-06-17 NOTE — Discharge Instructions (Addendum)
 As we discussed, please follow-up with cardiologist to seek further imaging of your heart for the pericarditis that was identified today.  Otherwise also follow-up with your primary care provider within the next 2 weeks for further assessment and follow-up.

## 2024-06-17 NOTE — ED Triage Notes (Addendum)
 Pain in throat/sternum. Painful to swallow. Painful to breath. Started Monday.  Only HX mentioned is gastritis. [place] interpreter used.

## 2024-06-18 ENCOUNTER — Other Ambulatory Visit (HOSPITAL_COMMUNITY): Payer: Self-pay

## 2024-06-21 ENCOUNTER — Encounter (HOSPITAL_COMMUNITY): Payer: Self-pay | Admitting: Emergency Medicine

## 2024-06-21 ENCOUNTER — Ambulatory Visit (HOSPITAL_COMMUNITY)
Admission: EM | Admit: 2024-06-21 | Discharge: 2024-06-21 | Disposition: A | Discharge status: Home / Self Care | Attending: Emergency Medicine | Admitting: Emergency Medicine

## 2024-06-21 DIAGNOSIS — R0602 Shortness of breath: Secondary | ICD-10-CM

## 2024-06-21 DIAGNOSIS — R070 Pain in throat: Secondary | ICD-10-CM | POA: Diagnosis not present

## 2024-06-21 DIAGNOSIS — R079 Chest pain, unspecified: Secondary | ICD-10-CM | POA: Diagnosis not present

## 2024-06-21 NOTE — ED Provider Notes (Signed)
 [facility]    CSN: [id number] Arrival date & time: 06/21/24  1312      History   Chief Complaint Chief Complaint  Patient presents with   Headache   Cough    HPI Alison Stanton is a 53 y.o. female.   Patient presents for the last month that she has had difficulty talking, breathing, and has been choking due to throat pain and swelling sensation.  Patient states that she did go to the emergency department on 8/7 where they did imaging and that she was told that she needed to follow-up with cardiology based on their findings.  Patient states that she has not yet scheduled an appointment with cardiology.  Patient states that over the last few days the swelling sensation in her throat has increased and she has had increased difficulty breathing.  Patient states that she is now very fatigued and is breathing rapidly and also reports some central chest pain with this.  Patient states that she has also been coughing frequently due to the sensation in her throat and has difficulty taking a deep breath because of this.  Patient also reports that when she stands up it feels like her head is going to explode because she has severe pain when she does this.  Patient states that she has also been seen for this and was prescribed ibuprofen  which she states does not help with her pain.  Patient denies having a primary care provider.  The history is provided by the patient and medical records. The history is limited by a language barrier. A language interpreter was used [place] interpreter).  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      Home Medications    Prior to Admission medications   Medication Sig Start Date End Date Taking? Authorizing Provider  colchicine  0.6 MG tablet Take 1 tablet (0.6 mg total) by mouth 2 (two) times daily. 06/17/24   Myriam Dorn BROCKS, PA  ibuprofen  (ADVIL ) 600 MG tablet Take 1 tablet (600 mg total) by mouth 4 (four) times daily. 06/17/24 07/18/24  Myriam Dorn BROCKS, PA  ibuprofen  (ADVIL ) 800 MG tablet Take 1 tablet (800 mg total) by mouth every 8 (eight) hours as needed (pain). 06/14/24   Vonna Sharlet POUR, MD    Family History Family History  Problem Relation Age of Onset   Hypertension Mother    Headache Father    Hypertension Father    Diabetes Sister     Social History Social History   Tobacco Use   Smoking status: Never   Smokeless tobacco: Never  Vaping Use   Vaping status: Never Used  Substance Use Topics   Alcohol use: Never   Drug use: No     Allergies   Patient has no known allergies.   Review of Systems Review of Systems  Respiratory:  Positive for cough.   Neurological:  Positive for headaches.  Per HPI  Physical Exam Triage Vital Signs ED Triage Vitals  Encounter Vitals Group     BP 06/21/24 1602 127/73     Girls Systolic BP Percentile --      Girls Diastolic BP Percentile --      Boys Systolic BP Percentile --      Boys Diastolic BP Percentile --      Pulse Rate 06/21/24 1602 94     Resp 06/21/24 1602 (!) 33     Temp 06/21/24 1602 98.2 F (36.8 C)     Temp Source 06/21/24 1602 Oral     SpO2 06/21/24 1602 94 %     Weight --      Height --      Head Circumference --      Peak Flow --      Pain Score 06/21/24 1601 10     Pain Loc --      Pain Education --      Exclude from Growth Chart --    No data found.  Updated Vital  Signs BP 127/73 (BP Location: Right Arm)   Pulse 94   Temp 98.2 F (36.8 C) (Oral)   Resp (!) 33   LMP 11/25/2021   SpO2 94%   Visual Acuity Right Eye Distance:   Left Eye Distance:   Bilateral Distance:    Right Eye Near:   Left Eye Near:    Bilateral Near:     Physical Exam Vitals and nursing note reviewed.  Constitutional:      General: She is awake.     Appearance: Normal appearance. She is well-developed and well-groomed. She is ill-appearing. She is not toxic-appearing or diaphoretic.  HENT:     Mouth/Throat:     Mouth: Mucous membranes are moist. No angioedema.     Pharynx: Oropharynx is clear. No pharyngeal swelling, posterior oropharyngeal erythema or uvula swelling.     Tonsils: No tonsillar abscesses.  Neck:     Thyroid: No thyroid mass.  Cardiovascular:     Rate and Rhythm: Normal rate and regular rhythm.  Pulmonary:     Effort: Tachypnea present.     Breath sounds: Normal breath sounds.  Musculoskeletal:     Cervical back: Normal range of motion and neck supple. No edema, rigidity or tenderness. Normal range of motion.  Lymphadenopathy:     Cervical: No cervical adenopathy.  Skin:    General: Skin is warm and dry.  Neurological:     Mental Status: She is alert.  Psychiatric:        Behavior: Behavior is cooperative.      UC Treatments / Results  Labs (all labs ordered are listed, but only abnormal results are displayed) Labs Reviewed - No data to display  EKG   Radiology No results found.  Procedures Procedures (including critical care time)  Medications Ordered in UC Medications - No data to display  Initial Impression / Assessment and Plan / UC Course  I have reviewed the triage vital signs and the nursing notes.  Pertinent labs & imaging results that were available during my care of the patient were reviewed by me and considered in my medical decision making (see chart for details).     Patient is mildly ill-appearing.   Tachypnea present on exam.  No other significant findings on exam.  Heart and lung sounds are normal.  EKG reveals normal sinus rhythm.  Without acute cardiac findings.  Recommended that patient be seen in the emergency  department for further evaluation of her symptoms due to symptoms worsening and concern for airway involvement.  Patient is understanding and agreeable to plan at this time.  Patient is stable to arrived to the ER via POV with family.  Final Clinical Impressions(s) / UC Diagnoses   Final diagnoses:  Throat pain  Shortness of breath  Chest pain, unspecified type     Discharge Instructions      Please go to the emergency department for further evaluation of your symptoms.  Veuillez vous rendre au service des urgences pour [employer] plus approfondie de vos symptmes.     ED Prescriptions   None    PDMP not reviewed this encounter.   Johnie Flaming A, NP 06/21/24 [phone]

## 2024-06-21 NOTE — Discharge Instructions (Addendum)
 Please go to the emergency department for further evaluation of your symptoms.  Veuillez vous rendre au service des urgences pour [employer] plus approfondie de vos symptmes.

## 2024-06-21 NOTE — ED Triage Notes (Signed)
 Used [place] interpretor  Pt reports she can't talk, can't walk, choking, I can't do anything for more than a month. They told me I needed an xray. When this RN asked patient who they was that told her to get an xray  she responds, the emergency room. This RN asked the patient if she had any scans at the hospital. She said she did. She said she was told to follow up with a cardiologist. This RN asked patient if she made an appt to see a cardiologist. The patient has not made an appt.  Pt reports when she gets up it feels like her head is going to explode for over a month as well. Pt reports that she was prescribed ibuprofen  but doesn't help with pain.  This RN asked patient if she has a PCP. Pt states yes I see yall here. This RN explained we are not a PCP office and we would give her resources to get set up with one.

## 2024-06-22 ENCOUNTER — Emergency Department (HOSPITAL_BASED_OUTPATIENT_CLINIC_OR_DEPARTMENT_OTHER): Admitting: Radiology

## 2024-06-22 ENCOUNTER — Other Ambulatory Visit: Payer: Self-pay

## 2024-06-22 ENCOUNTER — Encounter (HOSPITAL_BASED_OUTPATIENT_CLINIC_OR_DEPARTMENT_OTHER): Payer: Self-pay

## 2024-06-22 ENCOUNTER — Emergency Department (HOSPITAL_BASED_OUTPATIENT_CLINIC_OR_DEPARTMENT_OTHER)

## 2024-06-22 ENCOUNTER — Emergency Department (HOSPITAL_BASED_OUTPATIENT_CLINIC_OR_DEPARTMENT_OTHER): Admission: EM | Admit: 2024-06-22 | Discharge: 2024-06-23 | Disposition: A | Discharge status: Home / Self Care

## 2024-06-22 DIAGNOSIS — R059 Cough, unspecified: Secondary | ICD-10-CM | POA: Diagnosis present

## 2024-06-22 DIAGNOSIS — K219 Gastro-esophageal reflux disease without esophagitis: Secondary | ICD-10-CM | POA: Diagnosis not present

## 2024-06-22 DIAGNOSIS — I300 Acute nonspecific idiopathic pericarditis: Secondary | ICD-10-CM | POA: Diagnosis not present

## 2024-06-22 LAB — CBC
HCT: 34.1 % — ABNORMAL LOW (ref 36.0–46.0)
Hemoglobin: 11 g/dL — ABNORMAL LOW (ref 12.0–15.0)
MCH: 28.9 pg (ref 26.0–34.0)
MCHC: 32.3 g/dL (ref 30.0–36.0)
MCV: 89.5 fL (ref 80.0–100.0)
Platelets: 333 K/uL (ref 150–400)
RBC: 3.81 MIL/uL — ABNORMAL LOW (ref 3.87–5.11)
RDW: 13 % (ref 11.5–15.5)
WBC: 8.4 K/uL (ref 4.0–10.5)
nRBC: 0 % (ref 0.0–0.2)

## 2024-06-22 LAB — BASIC METABOLIC PANEL WITH GFR
Anion gap: 12 (ref 5–15)
BUN: 6 mg/dL (ref 6–20)
CO2: 25 mmol/L (ref 22–32)
Calcium: 9.8 mg/dL (ref 8.9–10.3)
Chloride: 99 mmol/L (ref 98–111)
Creatinine, Ser: 0.66 mg/dL (ref 0.44–1.00)
GFR, Estimated: >60 mL/min (ref >60)
Glucose, Bld: 105 mg/dL — ABNORMAL HIGH (ref 70–99)
Potassium: 3.8 mmol/L (ref 3.5–5.1)
Sodium: 136 mmol/L (ref 135–145)

## 2024-06-22 LAB — TROPONIN T, HIGH SENSITIVITY: Troponin T High Sensitivity: <15 ng/L (ref 0–19)

## 2024-06-22 LAB — GROUP A STREP BY PCR: Group A Strep by PCR: NOT DETECTED

## 2024-06-22 MED ORDER — DEXAMETHASONE SODIUM PHOSPHATE 10 MG/ML IJ SOLN: 8.0000 mg | Freq: Once | INTRAMUSCULAR | Status: DC

## 2024-06-22 MED ORDER — PANTOPRAZOLE SODIUM 40 MG IV SOLR
40.0000 mg | Freq: Once | INTRAVENOUS | Status: AC
  Administered 2024-06-22 (×2): 40 mg via INTRAVENOUS
  Filled 2024-06-22: qty 10

## 2024-06-22 MED ORDER — ACETAMINOPHEN 500 MG PO TABS
1000.0000 mg | ORAL_TABLET | Freq: Once | ORAL | Status: AC
  Administered 2024-06-22 (×2): 1000 mg via ORAL
  Filled 2024-06-22: qty 2

## 2024-06-22 MED ORDER — IOHEXOL 350 MG/ML SOLN
100.0000 mL | Freq: Once | INTRAVENOUS | Status: AC | PRN
  Administered 2024-06-22 (×2): 100 mL via INTRAVENOUS

## 2024-06-22 MED ORDER — ONDANSETRON HCL 4 MG/2ML IJ SOLN
4.0000 mg | Freq: Once | INTRAMUSCULAR | Status: AC
  Administered 2024-06-22 (×2): 4 mg via INTRAVENOUS
  Filled 2024-06-22: qty 2

## 2024-06-22 NOTE — ED Triage Notes (Addendum)
 Patient arrives with multiple discharge paperwork from [facility]. She reports cough, headache and throat pain. Also says it feels like she is suffocating. For more than a month. Patient speaks [place].

## 2024-06-22 NOTE — ED Provider Notes (Signed)
 [place] EMERGENCY DEPARTMENT AT [facility] Provider Note   CSN: [id number] Arrival date & time: 06/22/24  8190     Patient presents with: Cough, Headache, Sore Throat, and Shortness of Breath   Alison Stanton is a 53 y.o. female with PMHx pericarditis, headaches, environmental allergies who presents to ED concerned for throat swelling that has been present for at least one week progressively getting worse over the past 2 days. Patient also with chest pain that is similar since being prescribed colchicine  for her pericarditis 5 days ago. Patient stating that she has rib pain when she coughs. Patient is not complaint on colchicine  stating that it made her itchy today. Patient also endorses subjective fevers and body aches. Denies nausea, vomiting, diarrhea. Patient has not taken any medications for her pain today.   Patient tachypneic during initial interview but is able to speak in complete sentences without difficulty and O2 saturating appropriately.  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    Cough Associated symptoms: headaches and shortness of breath   Headache Associated symptoms: cough   Sore Throat Associated symptoms include headaches and shortness of breath.  Shortness of Breath Associated symptoms: cough and headaches        Prior to Admission medications   Medication Sig Start Date End Date Taking? Authorizing Provider  famotidine  (PEPCID ) 20 MG tablet Take 1 tablet (20 mg total) by mouth 2 (two) times daily. 06/23/24  Yes Hoy Fraction F, PA-C  colchicine  0.6 MG tablet Take 1 tablet (0.6 mg total) by mouth 2 (two) times daily. 06/17/24   Myriam Dorn BROCKS, PA  ibuprofen  (ADVIL ) 600 MG tablet Take 1 tablet (600 mg total) by mouth 4 (four) times daily. 06/17/24 07/18/24  Myriam Dorn BROCKS, PA  ibuprofen  (ADVIL ) 800 MG tablet Take 1 tablet (800 mg total) by mouth every 8 (eight) hours as needed (pain). 06/14/24   Banister, Pamela K, MD    Allergies: Patient  has no known allergies.    Review of Systems  Respiratory:  Positive for cough and shortness of breath.   Neurological:  Positive for headaches.    Updated Vital Signs BP 109/72   Pulse 88   Temp 98.9 F (37.2 C) (Oral)   Resp (!) 31   LMP 11/25/2021   SpO2 95%   Physical Exam Vitals and nursing note reviewed.  Constitutional:      General: She is not in acute distress.    Appearance: She is not ill-appearing or toxic-appearing.  HENT:     Head: Normocephalic and atraumatic.     Mouth/Throat:     Mouth: Mucous membranes are moist.     Pharynx: No oropharyngeal exudate or posterior oropharyngeal erythema.     Comments: No oropharyngeal swelling or tonsillar exudate/erythema. Eyes:     General: No scleral icterus.       Right eye: No discharge.        Left eye: No discharge.     Conjunctiva/sclera: Conjunctivae normal.  Cardiovascular:     Rate and Rhythm: Normal rate and regular rhythm.     Pulses: Normal pulses.     Heart sounds: No murmur heard. Pulmonary:     Effort: Pulmonary effort is normal. No respiratory distress.     Breath sounds: Normal breath sounds. No wheezing, rhonchi or rales.  Abdominal:     General: Bowel sounds are normal. There is no distension.     Palpations: Abdomen is soft. There is no mass.     Tenderness: There  is no abdominal tenderness.  Musculoskeletal:     Right lower leg: No edema.     Left lower leg: No edema.  Skin:    General: Skin is warm and dry.     Findings: No rash.  Neurological:     General: No focal deficit present.     Mental Status: She is alert and oriented to person, place, and time. Mental status is at baseline.     GCS: GCS eye subscore is 4. GCS verbal subscore is 5. GCS motor subscore is 6.  Psychiatric:        Mood and Affect: Mood normal.        Behavior: Behavior normal.     (all labs ordered are listed, but only abnormal results are displayed) Labs Reviewed  BASIC METABOLIC PANEL WITH GFR - Abnormal;  Notable for the following components:      Result Value   Glucose, Bld 105 (*)    All other components within normal limits  CBC - Abnormal; Notable for the following components:   RBC 3.81 (*)    Hemoglobin 11.0 (*)    HCT 34.1 (*)    All other components within normal limits  GROUP A STREP BY PCR  TROPONIN T, HIGH SENSITIVITY    EKG: None  Radiology: CT Soft Tissue Neck W Contrast Result Date: 06/22/2024 CLINICAL DATA:  Initial evaluation for acute soft tissue infection, cough, throat pain. EXAM: CT NECK WITH CONTRAST TECHNIQUE: Multidetector CT imaging of the neck was performed using the standard protocol following the bolus administration of intravenous contrast. RADIATION DOSE REDUCTION: This exam was performed according to the departmental dose-optimization program which includes automated exposure control, adjustment of the mA and/or kV according to patient size and/or use of iterative reconstruction technique. CONTRAST:  OMNIPAQUE  IOHEXOL  350 MG/ML SOLN COMPARISON:  None Available. FINDINGS: Pharynx and larynx: Oral cavity within normal limits. No acute abnormality about the dentition. Oropharynx and nasopharynx within normal limits. No retropharyngeal collection or swelling. Negative epiglottis. Hypopharynx, supraglottic larynx, glottis within normal limits. Subglottic airway clear. Salivary glands: Salivary glands including the parotid and submandibular glands are within normal limits. Thyroid: Normal. Lymph nodes: No enlarged or pathologic lymph nodes within the neck. Vascular: Normal intravascular enhancement seen within the neck. Limited intracranial: Unremarkable. Visualized orbits: Unremarkable. Mastoids and visualized paranasal sinuses: Chronic left sphenoid sinusitis. Paranasal sinuses are otherwise largely clear. Visualized mastoids and middle ear cavities are clear. Skeleton: No discrete or worrisome osseous lesions. Mild cervical spondylosis for age. Upper chest: No other  acute finding. Other: None. IMPRESSION: 1. Negative CT of the neck. No acute inflammatory changes or other abnormality identified. 2. Chronic left sphenoid sinusitis. Electronically Signed   By: Morene Hoard M.D.   On: 06/22/2024 23:15   CT Angio Chest PE W/Cm &/Or Wo Cm Result Date: 06/22/2024 CLINICAL DATA:  Cough EXAM: CT ANGIOGRAPHY CHEST WITH CONTRAST TECHNIQUE: Multidetector CT imaging of the chest was performed using the standard protocol during bolus administration of intravenous contrast. Multiplanar CT image reconstructions and MIPs were obtained to evaluate the vascular anatomy. RADIATION DOSE REDUCTION: This exam was performed according to the departmental dose-optimization program which includes automated exposure control, adjustment of the mA and/or kV according to patient size and/or use of iterative reconstruction technique. CONTRAST:  OMNIPAQUE  IOHEXOL  350 MG/ML SOLN COMPARISON:  Chest x-ray from earlier in the same day. CT from 06/17/2024 FINDINGS: Cardiovascular: Thoracic aorta is within normal limits. Heart is not significantly enlarged in size. Small  pericardial effusion is noted which is stable from the prior exam. Pulmonary artery shows a normal branching pattern bilaterally. No filling defect to suggest pulmonary embolism is noted. Mediastinum/Nodes: Thoracic inlet is within normal limits. No hilar or mediastinal adenopathy is noted. The esophagus as visualized is within normal limits. Lungs/Pleura: Lungs are well aerated bilaterally. Basilar atelectasis is noted bilaterally. No focal infiltrate or sizable effusion is seen. No parenchymal nodules are noted. Upper Abdomen: No acute abnormality. Musculoskeletal: No chest wall abnormality. No acute or significant osseous findings. Review of the MIP images confirms the above findings. IMPRESSION: Stable pericardial effusion when compared with the prior exam. No evidence of pulmonary emboli. Mild bibasilar atelectasis.  Electronically Signed   By: Oneil Devonshire M.D.   On: 06/22/2024 21:38   DG Chest Port 1 View Result Date: 06/22/2024 CLINICAL DATA:  Shortness of breath EXAM: PORTABLE CHEST 1 VIEW COMPARISON:  Chest x-ray 06/17/2024. FINDINGS: Heart is enlarged. There is a band of atelectasis or scarring in the left mid lung. The lungs are otherwise clear. There is no pleural effusion or pneumothorax. No acute fractures are seen. IMPRESSION: 1. Cardiomegaly. 2. Band of atelectasis or scarring in the left mid lung. Electronically Signed   By: Greig Pique M.D.   On: 06/22/2024 19:40     Procedures   Medications Ordered in the ED  pantoprazole  (PROTONIX ) injection 40 mg (40 mg Intravenous Given 06/22/24 2054)  acetaminophen  (TYLENOL ) tablet 1,000 mg (1,000 mg Oral Given 06/22/24 2055)  iohexol  (OMNIPAQUE ) 350 MG/ML injection 100 mL (100 mLs Intravenous Contrast Given 06/22/24 2040)  ondansetron  (ZOFRAN ) injection 4 mg (4 mg Intravenous Given 06/22/24 2109)                                    Medical Decision Making Amount and/or Complexity of Data Reviewed Labs: ordered. Radiology: ordered.  Risk OTC drugs. Prescription drug management.   This patient presents to the ED for concern of chest pain, this involves an extensive number of treatment options, and is a complaint that carries with it a high risk of complications and morbidity.  The differential diagnosis includes acute coronary syndrome, congestive heart failure, pericarditis, pneumonia, pulmonary embolism, tension pneumothorax, esophageal rupture, aortic dissection, cardiac tamponade, musculoskeletal   Co morbidities that complicate the patient evaluation  pericarditis, headaches, environmental allergies    Additional history obtained:  Additional history obtained from 06/17/2024 ED visit with CT findings of small pericardial effusion.  Patient started on colchicine .   Problem List / ED Course / Critical interventions / Medication  management  Patient presents to ED concern for multiple vague complaints including worsening throat swelling, SOB, rib pain, body aches, headaches, subjective fevers.  Patient seen in ED multiple times and was recently diagnosed with a small pericardial effusion and pericarditis and viral syndrome.  Patient did not take any pain medicine for her pericarditis today.  Patient stated that the colchicine  made her itch yesterday.  Patient exam reassuring.  Patient afebrile with stable vitals. I Ordered, and personally interpreted labs.  Strep PCR negative.  CBC without leukocytosis.  There is mild anemia with hemoglobin 11.0.  BMP reassuring.  Troponin within normal limits. The patient was maintained on a cardiac monitor.  I personally viewed and interpreted the EKG/cardiac monitored which showed an underlying rhythm of: Sinus rhythm. I ordered imaging studies including chest xray, CT soft tissue neck, CTA chest to assess for process contributing  to patient's symptoms. I independently visualized and interpreted imaging which showed stable small pericardial effusion. I agree with the radiologist interpretation. Shared all results with patient.  Answered all questions.  Provided patient with Tylenol , Zofran , and Protonix .  Patient now feeling a lot better.  Educated patient on alternating Advil  and Tylenol  for pain control.  It appears that patient's throat swelling may also be due to acid reflux.  I will also prescribe her Pepcid .  Educated patient that she needs to follow-up with cardiology and her primary care provider.  Patient verbalized understanding of plan. I have reviewed the patients home medicines and have made adjustments as needed The patient has been appropriately medically screened and/or stabilized in the ED. I have low suspicion for any other emergent medical condition which would require further screening, evaluation or treatment in the ED or require inpatient management. At time of discharge the  patient is hemodynamically stable and in no acute distress. I have discussed work-up results and diagnosis with patient and answered all questions. Patient is agreeable with discharge plan. We discussed strict return precautions for returning to the emergency department and they verbalized understanding.     Social Determinants of Health:  Foreign language      Final diagnoses:  Acute idiopathic pericarditis  Gastroesophageal reflux disease, unspecified whether esophagitis present    ED Discharge Orders          Ordered    famotidine  (PEPCID ) 20 MG tablet  2 times daily        06/23/24 0011    Ambulatory referral to Cardiology       Comments: If you have not heard from the Cardiology office within the next 72 hours please call [phone].   06/23/24 0013               Hoy Nidia FALCON, PA-C 06/23/24 0014    Simon Lavonia SAILOR, MD 06/23/24 [phone]

## 2024-06-23 ENCOUNTER — Other Ambulatory Visit (HOSPITAL_COMMUNITY): Payer: Self-pay

## 2024-06-23 MED ORDER — FAMOTIDINE 20 MG PO TABS
20.0000 mg | ORAL_TABLET | Freq: Two times a day (BID) | ORAL | 0 refills | Status: AC
  Filled 2024-06-23: qty 30, 15d supply, fill #0

## 2024-06-23 NOTE — Discharge Instructions (Addendum)
 I am glad you are feeling better.  As discussed, you will need to follow-up with your primary care provider and a cardiologist.  Seek emergency care if experiencing any new or worsening symptoms.  Alternating between 650 mg Tylenol  and 400 mg Advil : The best way to alternate taking Acetaminophen  (example Tylenol ) and Ibuprofen  (example Advil /Motrin ) is to take them 3 hours apart. For example, if you take ibuprofen  at 6 am you can then take Tylenol  at 9 am. You can continue this regimen throughout the day, making sure you do not exceed the recommended maximum dose for each drug.   Je suis ravi(e) que vous vous sentiez mieux. Comme indiqu prcdemment, vous devrez consulter votre mdecin traitant et Advertising account executive. Consultez les urgences si des symptmes apparaissent ou s'aggravent.  Alternance entre 650 mg de Tylenol  et 400 mg d'Advil  : La meilleure faon d'alterner la prise de paractamol (par exemple, Tylenol ) et d'ibuprofne (par exemple, Advil /Motrin ) est de les prendre  3 heures d'intervalle. Par exemple, si vous prenez de l'ibuprofne  6 h, vous pouvez ensuite prendre du Tylenol   9 h. Vous pouvez poursuivre ce [employer] long de la journe, en veillant  ne pas dpasser la dose maximale recommande pour [employer].

## 2024-06-26 ENCOUNTER — Encounter (HOSPITAL_BASED_OUTPATIENT_CLINIC_OR_DEPARTMENT_OTHER): Payer: Self-pay

## 2024-06-26 ENCOUNTER — Other Ambulatory Visit: Payer: Self-pay

## 2024-06-26 ENCOUNTER — Emergency Department (HOSPITAL_BASED_OUTPATIENT_CLINIC_OR_DEPARTMENT_OTHER): Admitting: Radiology

## 2024-06-26 ENCOUNTER — Emergency Department (HOSPITAL_BASED_OUTPATIENT_CLINIC_OR_DEPARTMENT_OTHER)
Admission: EM | Admit: 2024-06-26 | Discharge: 2024-06-26 | Disposition: A | Discharge status: Home / Self Care | Attending: Emergency Medicine | Admitting: Emergency Medicine

## 2024-06-26 ENCOUNTER — Other Ambulatory Visit (HOSPITAL_BASED_OUTPATIENT_CLINIC_OR_DEPARTMENT_OTHER): Payer: Self-pay

## 2024-06-26 DIAGNOSIS — M791 Myalgia, unspecified site: Secondary | ICD-10-CM | POA: Insufficient documentation

## 2024-06-26 DIAGNOSIS — R509 Fever, unspecified: Secondary | ICD-10-CM | POA: Insufficient documentation

## 2024-06-26 DIAGNOSIS — R052 Subacute cough: Secondary | ICD-10-CM | POA: Diagnosis not present

## 2024-06-26 DIAGNOSIS — R059 Cough, unspecified: Secondary | ICD-10-CM | POA: Diagnosis present

## 2024-06-26 DIAGNOSIS — R0981 Nasal congestion: Secondary | ICD-10-CM | POA: Diagnosis not present

## 2024-06-26 LAB — SARS CORONAVIRUS 2 BY RT PCR: SARS Coronavirus 2 by RT PCR: NEGATIVE

## 2024-06-26 MED ORDER — GUAIFENESIN-CODEINE 100-10 MG/5ML PO SOLN
10.0000 mL | Freq: Once | ORAL | Status: AC
  Administered 2024-06-26: 10 mL via ORAL
  Filled 2024-06-26: qty 10

## 2024-06-26 MED ORDER — ALBUTEROL SULFATE HFA 108 (90 BASE) MCG/ACT IN AERS
2.0000 | INHALATION_SPRAY | RESPIRATORY_TRACT | Status: DC | PRN
  Administered 2024-06-26: 2 via RESPIRATORY_TRACT
  Filled 2024-06-26: qty 6.7

## 2024-06-26 MED ORDER — GUAIFENESIN 100 MG/5ML PO LIQD
100.0000 mg | ORAL | 0 refills | Status: AC | PRN
  Filled 2024-06-26: qty 473, 8d supply, fill #0

## 2024-06-26 MED ORDER — AEROCHAMBER PLUS FLO-VU MEDIUM MISC
1.0000 | Freq: Once | Status: AC
  Administered 2024-06-26: 1

## 2024-06-26 NOTE — Discharge Instructions (Signed)
 ### Fiche patient : la toux     La toux est un symptme frquent Arts development officer les voies respiratoires. Elle peut tre aigu (moins de 3 semaines), subaigu (3  8 semaines), ou chronique (plus de 8 semaines).[1][2] La plupart des toux [employer] causes par des infections virales comme le rhume, et disparaissent spontanment en quelques jours  semaines.[1]      **Causes frquentes de la toux :**      - **Infections des voies respiratoires** (rhume, bronchite)      - **Allergies** ou rhinite      - **Asthme**      - **Reflux gastro-osophagien** (remonte d'acide dans la gorge)      - **Effets secondaires de mdicaments** (par exemple, certains traitements contre l'hypertension)      - **Tabac** ou exposition  des irritants      **Quand faut-il consulter rapidement ?**      Il est important de consulter un mdecin si la toux s'accompagne de :      - Sang dans les crachats      - Fivre leve persistante      - Difficult  respirer ou essoufflement      - Douleur thoracique      - Perte de poids inexplique      - Antcdents de cancer ou d'immunodpression      **Prise en charge et traitements :**      La plupart des toux aigus ne ncessitent pas de traitement spcifique et disparaissent d'elles-mmes. Pour soulager les symptmes, plusieurs remdes peuvent tre utiliss :      - **Remdes non mdicamenteux** : Beaucoup de patients trouvent efficaces le miel, le citron, le thym et les tisanes. Ces options sont gnralement sres pour les [employer] enfants de plus [employer] an (le miel est dconseill chez les enfants de [place] an).[3]      - **viter les irritants** : Arrter de fumer et viter les environnements poussireux ou pollus peut aider.[1][2]      - **Hydratation** : Boire suffisamment d'eau aide  fluidifier les scrtions.      - **Mdicaments** : Les antitussifs (mdicaments pour calmer la toux) ne sont gnralement pas recommands sauf en cas  de toux trs gnante et persistante, et [employer] avis mdical. Le traitement dpend de la cause identifie (par exemple, traitement de l'asthme ou du reflux si ceux-ci sont responsables).[4][2]      **Toux chronique :**      Si la toux dure plus de 8 semaines [employer] (ou plus de 4 semaines chez l'enfant), il est recommand de consulter pour rechercher une cause sous-jacente.[5][6][2] Les causes les plus frquentes sont l'asthme, le reflux, et le syndrome de toux des voies ariennes suprieures (post-nasal drip).[5][2][7] Des examens complmentaires peuvent tre ncessaires (radiographie, spiromtrie, etc.).      **Chez l'enfant :**      La toux est souvent lie  des infections ou  l'asthme. Si elle est sche, sans gne respiratoire, une surveillance pendant 2 semaines est souvent suffisante.[5][6]      **En rsum :**      La toux est le plus souvent bnigne et transitoire. Il existe des remdes simples pour soulager les symptmes. Toutefois, une toux persistante ou accompagne de signes d'alerte doit amener  consulter pour un avis mdical et [employer] en charge adapte.      ### References  1. Does This Coughing Adolescent or Adult Patient Have Pertussis?SABRA Cornelia PB, Jacqulyn CHERRY, Charolett SOLES, [facility]  TB, Gonzales R. JAMA. 2010;304(8):890-6. doi:10.1001/[email or web address].[email or web address]. 2. Classification of Cough as a Symptom in Adults and Management Algorithms: CHEST Guideline and Expert Panel Report. Honore MINES, [place] DELBRA [place], [place]. Chest. 2018;153(1):196-209. doi:10.1016/[email or web address].2017.10.016. 3. Nonpharmacological Home Remedies for Upper Respiratory Tract Infections: A Cross-Sectional Study of Primary Care Patients in [place] and [place]. Ronnette SHAUNNA Browner NE, Moussa MA, Haller DM, Maisonneuve H. Family Practice. 2023;40(4):564-568. doi:10.1093/fampra/[email or web address]. 4. Overview of the Management of Cough: CHEST Guideline and Expert Panel Report. Honore MINES, [place] CT, Lewis SZ, Diekemper RL, Gold PM.  Chest. 2014;146(4):885-889. doi:[email or web address]/chest.14-1485. 5. Chronic Cough: Evaluation and Management. Sonoda K, Nayak R. American Family Physician. 2024;110(2):167-173. 6. Chronic Cough: Evaluation and Management. Michaudet C, Malaty J. American Family Physician. 2017;96(9):575-580. 7. Therapeutic and Mechanistic Advances in Chronic Cough. Zulema AT, Karin CULVER, Dicpinigaitis P, et al. Annals of Allergy, Asthma & Immunology : Official Publication of the American College of Allergy, Asthma, & Immunology. 2025;134(6):639-648. doi:[email or web address]/[email or web address].[email or web address].

## 2024-06-26 NOTE — ED Triage Notes (Signed)
 Present with cough and fever.  Cough productive yellowish States been over a month

## 2024-06-26 NOTE — ED Provider Notes (Signed)
 [place] EMERGENCY DEPARTMENT AT [facility] Provider Note   CSN: [id number] Arrival date & time: 06/26/24  1059     Patient presents with: Cough and Fever   Alison Stanton is a 53 y.o. female.   HPI History limited by language barrier history obtained through [place] interpreter with audio only. 53 year old female presents for evaluation of cough.  Patient has been evaluated 4 times since August 4.  She began having symptoms of fever and myalgias with chills on August 7.  On her August 4 visit, she was evaluated with COVID swab and strep culture, which were negative and treated with ibuprofen .  On August 7 she was seen for pain in her throat and sternum with painful swallowing and sharp pain in the left lower chest that radiated to her neck.  Her viral URI symptoms were reported to have worsened at that time.  Patient had EKG with sinus tachycardia.  CT of the abdomen was obtained due to some abdominal pain at that time.  Patient was found to have some pericardial effusion and this was discussed with cardiology.  Was thought to be mild pericarditis treated with colchicine  and ibuprofen .  She was referred for follow-up with cardiology with echocardiogram.  Troponins were normal.  Patient presented on August 11 to the [facility] urgent care complaining of headache and cough.  She complained of difficulty talking and breathing.  She was tachypneic and was sent to the ER for further evaluation.  In the ER on 812 she was complaining of cough headache sore throat and shortness of breath.  She is also complained of mold in her apartment.  On her evaluation August 12 she was found to be tachypneic and she had a CT angio of her chest performed.  He was supposed to be on colchicine  but had not dosed it on that day.  She was have found to have a stable small pericardial effusion.  She was provided Tylenol  Zofran  and Protonix .  She reported feeling better at that time.    Prior to Admission medications    Medication Sig Start Date End Date Taking? Authorizing Provider  guaiFENesin  (ROBITUSSIN) 100 MG/5ML liquid Take 5-10 mLs (100-200 mg total) by mouth every 4 (four) hours as needed for cough or to loosen phlegm. 06/26/24  Yes Levander Houston, MD  colchicine  0.6 MG tablet Take 1 tablet (0.6 mg total) by mouth 2 (two) times daily. 06/17/24   Myriam Dorn BROCKS, PA  famotidine  (PEPCID ) 20 MG tablet Take 1 tablet (20 mg total) by mouth 2 (two) times daily. 06/23/24   Hoy Nidia FALCON, PA-C  ibuprofen  (ADVIL ) 600 MG tablet Take 1 tablet (600 mg total) by mouth 4 (four) times daily. 06/17/24 07/18/24  Myriam Dorn BROCKS, PA  ibuprofen  (ADVIL ) 800 MG tablet Take 1 tablet (800 mg total) by mouth every 8 (eight) hours as needed (pain). 06/14/24   Banister, Pamela K, MD    Allergies: Patient has no known allergies.    Review of Systems  Updated Vital Signs BP 117/72 (BP Location: Right Arm)   Pulse 83   Temp 98.1 F (36.7 C) (Oral)   Resp 18   Ht 1.613 m (5' 3.5)   Wt 83.5 kg   LMP 11/25/2021   SpO2 98%   BMI 32.10 kg/m   Physical Exam Vitals and nursing note reviewed.  Constitutional:      General: She is not in acute distress.    Appearance: Normal appearance. She is not ill-appearing.  HENT:  Head: Normocephalic.     Right Ear: External ear normal.     Left Ear: External ear normal.     Nose: Nose normal.     Mouth/Throat:     Pharynx: Oropharynx is clear.  Eyes:     Pupils: Pupils are equal, round, and reactive to light.  Cardiovascular:     Rate and Rhythm: Normal rate and regular rhythm.     Pulses: Normal pulses.  Pulmonary:     Effort: Pulmonary effort is normal.     Breath sounds: Normal breath sounds.  Abdominal:     General: Abdomen is flat. Bowel sounds are normal.     Palpations: Abdomen is soft.  Musculoskeletal:        General: Normal range of motion.     Cervical back: Normal range of motion.  Skin:    General: Skin is warm and dry.     Capillary Refill:  Capillary refill takes less than 2 seconds.  Neurological:     General: No focal deficit present.     Mental Status: She is alert.  Psychiatric:        Mood and Affect: Mood normal.     (all labs ordered are listed, but only abnormal results are displayed) Labs Reviewed  SARS CORONAVIRUS 2 BY RT PCR    EKG: None  Radiology: DG Chest 2 View Result Date: 06/26/2024 EXAM: 2 VIEW(S) XRAY OF THE CHEST 06/26/2024 11:58:00 AM COMPARISON: 06/22/2024 CLINICAL HISTORY: Cough pericardial effusion. Had recent scans/x-rays for peri-cardial effusions and atelectasis, still with cough and SOB. FINDINGS: LUNGS AND PLEURA: Persistent bibasilar atelectasis. No pleural fluid or pneumothorax. HEART AND MEDIASTINUM: Unchanged enlargement of the cardiac silhouette which may reflect multichamber cardiac enlargement and/or pericardial effusion. BONES AND SOFT TISSUES: Chronic asymmetric elevation of the right hemidiaphragm. Surgical clips noted in the right upper quadrant of the abdomen. Multilevel endplate degenerative changes noted in the thoracic spine. IMPRESSION: 1. Unchanged enlargement of the cardiac silhouette, possibly reflecting multichamber cardiac enlargement and/or pericardial effusion. 2. Persistent bibasilar atelectasis. Electronically signed by: Waddell Calk MD 06/26/2024 12:22 PM EDT RP Workstation: [id number]     Procedures   Medications Ordered in the ED  albuterol  (VENTOLIN  HFA) 108 (90 Base) MCG/ACT inhaler 2 puff (2 puffs Inhalation Given 06/26/24 1146)  AeroChamber Plus Flo-Vu Medium MISC 1 each (1 each Other Given 06/26/24 1153)  guaiFENesin -codeine  100-10 MG/5ML solution 10 mL (10 mLs Oral Given 06/26/24 1148)                                    Medical Decision Making Amount and/or Complexity of Data Reviewed Radiology: ordered.  Risk OTC drugs. Prescription drug management.  53 year old female who presents today with cough congestion for several weeks.  Today she is chiefly  complaining of cough with posttussive emesis.  She has not replies that she has not had any cough medicine prescribed.  She did have a small pericardial effusion that was stable over 2 visits.  She was to follow-up with cardiology, but I cannot find any follow-up visit scheduled. Plan chest x-Arianny Pun to evaluate Will give cough medicine  Patient received cough medicine with some improvement. Patient advised of need for follow-up with cardiology and reports that she has a appointment. Advised of return precautions and voices understanding.    Final diagnoses:  Subacute cough    ED Discharge Orders  Ordered    guaiFENesin  (ROBITUSSIN) 100 MG/5ML liquid  Every 4 hours PRN        06/26/24 1302               Levander Houston, MD 06/26/24 1303

## 2024-06-29 ENCOUNTER — Encounter (HOSPITAL_BASED_OUTPATIENT_CLINIC_OR_DEPARTMENT_OTHER): Payer: Self-pay

## 2024-06-29 ENCOUNTER — Other Ambulatory Visit: Payer: Self-pay

## 2024-06-29 ENCOUNTER — Other Ambulatory Visit (HOSPITAL_BASED_OUTPATIENT_CLINIC_OR_DEPARTMENT_OTHER): Payer: Self-pay

## 2024-06-29 ENCOUNTER — Emergency Department (HOSPITAL_BASED_OUTPATIENT_CLINIC_OR_DEPARTMENT_OTHER): Admitting: Radiology

## 2024-06-29 ENCOUNTER — Emergency Department (HOSPITAL_BASED_OUTPATIENT_CLINIC_OR_DEPARTMENT_OTHER)
Admission: EM | Admit: 2024-06-29 | Discharge: 2024-06-29 | Disposition: A | Discharge status: Home / Self Care | Attending: Emergency Medicine | Admitting: Emergency Medicine

## 2024-06-29 DIAGNOSIS — J069 Acute upper respiratory infection, unspecified: Secondary | ICD-10-CM | POA: Diagnosis not present

## 2024-06-29 DIAGNOSIS — R059 Cough, unspecified: Secondary | ICD-10-CM | POA: Diagnosis present

## 2024-06-29 LAB — RESP PANEL BY RT-PCR (RSV, FLU A&B, COVID)  RVPGX2
Influenza A by PCR: NEGATIVE
Influenza B by PCR: NEGATIVE
Resp Syncytial Virus by PCR: NEGATIVE
SARS Coronavirus 2 by RT PCR: NEGATIVE

## 2024-06-29 MED ORDER — BENZONATATE 100 MG PO CAPS
200.0000 mg | ORAL_CAPSULE | Freq: Once | ORAL | Status: AC
  Administered 2024-06-29: 200 mg via ORAL
  Filled 2024-06-29: qty 2

## 2024-06-29 MED ORDER — BENZONATATE 100 MG PO CAPS
100.0000 mg | ORAL_CAPSULE | Freq: Three times a day (TID) | ORAL | 0 refills | Status: AC
  Filled 2024-06-29: qty 21, 7d supply, fill #0

## 2024-06-29 NOTE — ED Notes (Signed)

## 2024-06-29 NOTE — Discharge Instructions (Signed)
 Flu COVID and RSV negative.  Your chest x-ray is also normal.  I would recommend symptom management with warm tea honey and lemon.  Cough drops.  You can use Tessalon  Perles which is a cough medicine as needed.  Follow-up with primary care and return to emergency room with new or worsening symptoms.

## 2024-06-29 NOTE — ED Provider Notes (Signed)
 Alison Stanton   CSN: [id number] Arrival date & time: 06/29/24  1525     Patient presents with: Cough   Alison Stanton is a 53 y.o. female with complaint of nasal drainage, nasal congestion, cough ongoing for 4 days.  Cough is dry.  Reports chills at home but no fever.  No history of COPD or asthma.  No associated chest pain or shortness of breath.    Cough      Prior to Admission medications   Medication Sig Start Date End Date Taking? Authorizing Provider  colchicine  0.6 MG tablet Take 1 tablet (0.6 mg total) by mouth 2 (two) times daily. 06/17/24   Myriam Dorn BROCKS, PA  famotidine  (PEPCID ) 20 MG tablet Take 1 tablet (20 mg total) by mouth 2 (two) times daily. 06/23/24   Hoy Nidia FALCON, PA-C  guaiFENesin  (ROBITUSSIN) 100 MG/5ML liquid Take 5-10 mLs (100-200 mg total) by mouth every 4 (four) hours as needed for cough or to loosen phlegm. 06/26/24   Levander Houston, MD  ibuprofen  (ADVIL ) 600 MG tablet Take 1 tablet (600 mg total) by mouth 4 (four) times daily. 06/17/24 07/18/24  Myriam Dorn BROCKS, PA  ibuprofen  (ADVIL ) 800 MG tablet Take 1 tablet (800 mg total) by mouth every 8 (eight) hours as needed (pain). 06/14/24   Banister, Pamela K, MD    Allergies: Patient has no known allergies.    Review of Systems  Respiratory:  Positive for cough.     Updated Vital Signs BP 100/79 (BP Location: Right Arm)   Pulse 87   Temp 97.9 F (36.6 C) (Temporal)   Resp 20   LMP 11/25/2021   SpO2 100%   Physical Exam Vitals and nursing Stanton reviewed.  Constitutional:      General: She is not in acute distress.    Appearance: She is not toxic-appearing.  HENT:     Head: Normocephalic and atraumatic.  Eyes:     General: No scleral icterus.    Conjunctiva/sclera: Conjunctivae normal.  Cardiovascular:     Rate and Rhythm: Normal rate and regular rhythm.     Pulses: Normal pulses.     Heart sounds: Normal heart sounds.   Pulmonary:     Effort: Pulmonary effort is normal. No respiratory distress.     Breath sounds: Normal breath sounds.  Abdominal:     General: Abdomen is flat. Bowel sounds are normal.     Palpations: Abdomen is soft.     Tenderness: There is no abdominal tenderness.  Musculoskeletal:     Right lower leg: No edema.     Left lower leg: No edema.  Skin:    General: Skin is warm and dry.     Findings: No lesion.  Neurological:     General: No focal deficit present.     Mental Status: She is alert and oriented to person, place, and time. Mental status is at baseline.     (all labs ordered are listed, but only abnormal results are displayed) Labs Reviewed  RESP PANEL BY RT-PCR (RSV, FLU A&B, COVID)  [id number]    EKG: None  Radiology: DG Chest 2 View Result Date: 06/29/2024 CLINICAL DATA:  Cough for several weeks.  Fever. EXAM: CHEST - 2 VIEW COMPARISON:  June 26, 2024. FINDINGS: Stable cardiomediastinal silhouette. Both lungs are clear. The visualized skeletal structures are unremarkable. IMPRESSION: No active cardiopulmonary disease. Electronically Signed   By: Lynwood Landy Raddle M.D.   On:  06/29/2024 16:55     Procedures   Medications Ordered in the ED  benzonatate  (TESSALON ) capsule 200 mg (has no administration in time range)                                    Medical Decision Making Amount and/or Complexity of Data Reviewed Radiology: ordered.  Risk Prescription drug management.   [name] 53 y.o. presented today for URI like symptoms. Working DDx that I considered at this time includes, but not limited to, viral illness, pharyngitis, mono, sinusitis, electrolyte abnormality, AOM.  R/o DDx: these additional diagnoses are not consistent with patient's history, presentation, physical exam, labs/imaging findings.  Labs:  Respiratory Panel: Negative   Imaging:  Chest x-ray negative for acute findings.  Problem List / ED Course / Critical interventions /  Medication management  Patient presenting with complaint of URI-like symptoms.  Notes some nasal congestion, feeling ill and having frequent dry cough.  Denies any fever.  Vital signs are stable here.  She is well-appearing.  Lungs clear to auscultation bilaterally without any wheezing.  She is not hypoxic.  She is no history of COPD or asthma.  Her respiratory panel is negative and her chest x-ray is also negative.  She was given Tessalon  Perles.  Symptoms seem most consistent with viral URI with cough.  We discussed symptom management and follow-up with primary care for recheck of symptoms.  Patient has been hemodynamically stable throughout stay.     Final diagnoses:  Viral URI with cough    ED Discharge Orders          Ordered    benzonatate  (TESSALON ) 100 MG capsule  Every 8 hours        06/29/24 1710               Jaden Batchelder, Warren SAILOR, PA-C 06/29/24 1719    Jerrol Agent, MD 06/29/24 [phone]

## 2024-06-29 NOTE — ED Notes (Signed)
 Patient transported to X-ray

## 2024-06-29 NOTE — ED Triage Notes (Signed)
 Patient arrives with a cough she has had for weeks. She also reports fever and feeling tired. She reports being here recently but nothing has helped.

## 2024-07-13 ENCOUNTER — Emergency Department (HOSPITAL_COMMUNITY)

## 2024-07-13 ENCOUNTER — Emergency Department (HOSPITAL_COMMUNITY)
Admission: EM | Admit: 2024-07-13 | Discharge: 2024-07-13 | Disposition: A | Discharge status: Home / Self Care | Source: Ambulatory Visit | Attending: Emergency Medicine | Admitting: Emergency Medicine

## 2024-07-13 ENCOUNTER — Other Ambulatory Visit: Payer: Self-pay

## 2024-07-13 ENCOUNTER — Other Ambulatory Visit (HOSPITAL_COMMUNITY): Payer: Self-pay

## 2024-07-13 DIAGNOSIS — R059 Cough, unspecified: Secondary | ICD-10-CM | POA: Insufficient documentation

## 2024-07-13 DIAGNOSIS — R509 Fever, unspecified: Secondary | ICD-10-CM | POA: Insufficient documentation

## 2024-07-13 DIAGNOSIS — R079 Chest pain, unspecified: Secondary | ICD-10-CM | POA: Insufficient documentation

## 2024-07-13 DIAGNOSIS — R0602 Shortness of breath: Secondary | ICD-10-CM | POA: Insufficient documentation

## 2024-07-13 DIAGNOSIS — R519 Headache, unspecified: Secondary | ICD-10-CM | POA: Diagnosis not present

## 2024-07-13 DIAGNOSIS — M791 Myalgia, unspecified site: Secondary | ICD-10-CM | POA: Diagnosis not present

## 2024-07-13 LAB — BASIC METABOLIC PANEL WITH GFR
Anion gap: 12 (ref 5–15)
BUN: <5 mg/dL — ABNORMAL LOW (ref 6–20)
CO2: 25 mmol/L (ref 22–32)
Calcium: 9.4 mg/dL (ref 8.9–10.3)
Chloride: 102 mmol/L (ref 98–111)
Creatinine, Ser: 0.58 mg/dL (ref 0.44–1.00)
GFR, Estimated: >60 mL/min (ref >60)
Glucose, Bld: 89 mg/dL (ref 70–99)
Potassium: 3.6 mmol/L (ref 3.5–5.1)
Sodium: 139 mmol/L (ref 135–145)

## 2024-07-13 LAB — CBC
HCT: 35.9 % — ABNORMAL LOW (ref 36.0–46.0)
Hemoglobin: 11.1 g/dL — ABNORMAL LOW (ref 12.0–15.0)
MCH: 27.2 pg (ref 26.0–34.0)
MCHC: 30.9 g/dL (ref 30.0–36.0)
MCV: 88 fL (ref 80.0–100.0)
Platelets: 277 K/uL (ref 150–400)
RBC: 4.08 MIL/uL (ref 3.87–5.11)
RDW: 12.9 % (ref 11.5–15.5)
WBC: 9.3 K/uL (ref 4.0–10.5)
nRBC: 0 % (ref 0.0–0.2)

## 2024-07-13 MED ORDER — DOXYCYCLINE HYCLATE 100 MG PO CAPS
100.0000 mg | ORAL_CAPSULE | Freq: Two times a day (BID) | ORAL | 0 refills | Status: AC
  Filled 2024-07-13: qty 14, 7d supply, fill #0

## 2024-07-13 NOTE — ED Provider Notes (Signed)
 [place] EMERGENCY DEPARTMENT AT [facility] Provider Note   CSN: [id number] Arrival date & time: 07/13/24  1133     Patient presents with: Generalized Body Aches, Shortness of Breath, and Headache   Alison Stanton is a 53 y.o. female.   53 year old female presenting with cough/chest pain that has been ongoing for 2 months.  She describes poking chest pain associated with cough, she notes productive cough that is occasionally yellow/bloody in appearance, says the pain goes from my chest to the top of my head.  She has tried ibuprofen  and Tessalon  Perles without relief of her symptoms.  She endorses subjective fevers at home, she does not have a thermometer therefore does not check her temperature but feels this daily. She has no history of lung disease, denies recent international travel.    Shortness of Breath Associated symptoms: cough   Headache Associated symptoms: cough        Prior to Admission medications   Medication Sig Start Date End Date Taking? Authorizing Provider  benzonatate  (TESSALON ) 100 MG capsule Take 1 capsule (100 mg total) by mouth every 8 (eight) hours. 06/29/24   Barrett, Warren SAILOR, PA-C  colchicine  0.6 MG tablet Take 1 tablet (0.6 mg total) by mouth 2 (two) times daily. 06/17/24   Myriam Dorn BROCKS, PA  famotidine  (PEPCID ) 20 MG tablet Take 1 tablet (20 mg total) by mouth 2 (two) times daily. 06/23/24   Hoy Nidia FALCON, PA-C  guaiFENesin  (ROBITUSSIN) 100 MG/5ML liquid Take 5-10 mLs (100-200 mg total) by mouth every 4 (four) hours as needed for cough or to loosen phlegm. 06/26/24   Levander Houston, MD  ibuprofen  (ADVIL ) 600 MG tablet Take 1 tablet (600 mg total) by mouth 4 (four) times daily. 06/17/24 07/18/24  Myriam Dorn BROCKS, PA  ibuprofen  (ADVIL ) 800 MG tablet Take 1 tablet (800 mg total) by mouth every 8 (eight) hours as needed (pain). 06/14/24   Banister, Pamela K, MD    Allergies: Patient has no known allergies.    Review of Systems   Respiratory:  Positive for cough.     Updated Vital Signs  Vitals:   07/13/24 1147 07/13/24 1358  BP:  (!) 140/86  Pulse:  91  Resp:  18  Temp:  98.8 F (37.1 C)  TempSrc:  Oral  SpO2:  96%  Weight: 83.5 kg   Height: 5' 5 (1.651 m)      Physical Exam Vitals and nursing note reviewed.  HENT:     Head: Normocephalic.  Eyes:     Extraocular Movements: Extraocular movements intact.  Cardiovascular:     Rate and Rhythm: Normal rate and regular rhythm.  Pulmonary:     Effort: Pulmonary effort is normal.     Breath sounds: Normal breath sounds.     Comments: Frequent coughing with sputum production Musculoskeletal:     Cervical back: Normal range of motion.     Comments: Full ROM of back/neck. R sided parathoracic tenderness to palpation. Mild paracervical muscle tenderness to palpation.  Skin:    General: Skin is warm and dry.  Neurological:     Mental Status: She is alert and oriented to person, place, and time.     Sensory: No sensory deficit.     Motor: No weakness.     (all labs ordered are listed, but only abnormal results are displayed) Labs Reviewed  BASIC METABOLIC PANEL WITH GFR - Abnormal; Notable for the following components:      Result Value  BUN <5 (*)    All other components within normal limits  CBC - Abnormal; Notable for the following components:   Hemoglobin 11.1 (*)    HCT 35.9 (*)    All other components within normal limits    EKG: None  Radiology: DG Chest 2 View Result Date: 07/13/2024 CLINICAL DATA:  Shortness of breath. EXAM: CHEST - 2 VIEW COMPARISON:  June 29, 2024. FINDINGS: Stable cardiomediastinal silhouette. Minimal right basilar and left midlung subsegmental atelectasis is noted. Bony thorax is unremarkable. Elevated right hemidiaphragm. IMPRESSION: Minimal right basilar and left midlung subsegmental atelectasis. Electronically Signed   By: Lynwood Landy Raddle M.D.   On: 07/13/2024 12:43     Procedures   Medications Ordered  in the ED - No data to display                                  Medical Decision Making This patient presents to the ED for concern of cough, this involves an extensive number of treatment options, and is a complaint that carries with it a high risk of complications and morbidity.  The differential diagnosis includes COVID/flu/RSV, pneumonia, bronchitis, tuberculosis   Co morbidities that complicate the patient evaluation  Recent history of viral URI and pericarditis   Additional history obtained:  Additional history obtained from record review External records from outside source obtained and reviewed including prior ED notes   Lab Tests:  I Ordered, and personally interpreted labs.  The pertinent results include: CBC notable for hemoglobin of 11.1, however this is stable as compared to previous lab results from 3 weeks ago.  BMP unremarkable.   Imaging Studies ordered:  I ordered imaging studies including CXR  I independently visualized and interpreted imaging which showed Minimal right basilar and left midlung subsegmental atelectasis.  I agree with the radiologist interpretation   Cardiac Monitoring: / EKG:  The patient was maintained on a cardiac monitor.  I personally viewed and interpreted the cardiac monitored which showed an underlying rhythm of: NSR   Problem List / ED Course / Critical interventions / Medication management I have reviewed the patients home medicines and have made adjustments as needed   Social Determinants of Health:  Depression   Test / Admission - Considered:  Physical exam notable as above, lungs are clear to auscultation bilaterally however patient does have frequent cough with sputum production, chest x-ray shows new right basilar/left midlung subsegmental atelectasis as compared to prior study earlier this month.  I am concerned for underlying infectious process, like pneumonia or tuberculosis, that may be contributing to her symptoms  today, therefore I would like to proceed with CT chest imaging.  She has a recent history of pericarditis and viral URI is contributing to her cough.  CT chest with contrast ordered. I was called to the patient's bedside by nursing staff as patient tells me  I have been here all day and I am ready to leave.  I discussed in depth with the patient that I am concerned for an infectious etiology contributing to her symptoms, she reiterated that she does not want to wait to have CT imaging of her chest obtained.  Patient is in her sound mind to make these decisions, at this time her vitals are largely reassuring, she is not in any respiratory distress, and her workup is otherwise reassuring as above.  I will start the patient on doxycycline , as I am  concerned that she may have an underlying pneumonia given that her cough/sputum production has been worsening over the past 2 months and that she endorses daily subjective fevers. I encouraged the patient to follow-up with her primary care provider if her symptoms persist, and to return to the emergency department if her symptoms worsen.  She was prescribed Tessalon  by previous provider at a separate emergency department visit, I encouraged her to continue this for cough as well as other over-the-counter agents.  She voiced understanding is in agreement with this plan.  She is appropriate discharge at this time.    Amount and/or Complexity of Data Reviewed Labs: ordered. Radiology: ordered.  Risk Prescription drug management.        Final diagnoses:  Cough, unspecified type    ED Discharge Orders          Ordered    doxycycline  (VIBRAMYCIN ) 100 MG capsule  2 times daily        07/13/24 [address] 07/13/24 2148    Garrick Charleston, MD 07/14/24 [phone]

## 2024-07-13 NOTE — ED Triage Notes (Signed)
 53 y/o female comes in c/o generalized body aches, sob and a headache that started about 2 months ago. Pt reports she has been seen in the ER in the past with similar symptoms and reports I have to go to the Cardiologist in October; however, pt does not recall why she has an appointment.

## 2024-07-13 NOTE — Discharge Instructions (Signed)
 Commencez la doxycycline , 1 comprim par voie orale deux fois par jour pendant 7 jours. Continuez le Tessalon  toutes les 8 heures au besoin pour la toux. Continuez l'ibuprofne/Tylenol  au besoin. Retournez aux urgences si vos symptmes s'aggravent. Suivez avec votre mdecin traitant si vos symptmes persistent.

## 2024-08-20 NOTE — Progress Notes (Signed)
 Cardiology Office Note:   Date:  08/27/2024  ID:  Alison Stanton, DOB [birth year], MRN [id number] PCP:  Alec House, MD  [facility] Providers Cardiologist:  Wendel Haws, MD Referring MD: Hoy Nidia Alison Stanton*  Chief Complaint/Reason for Referral: Pericarditis ASSESSMENT:    1. Idiopathic pericarditis, unspecified chronicity   2. Hyperlipidemia LDL goal <70   3. Aortic atherosclerosis   4. Medication management     PLAN:   In order of problems listed above: Pericarditis: Will obtain CRP and echocardiogram.  Continue colchicine  for 1 more month.  Stop NSAIDs.  Start aspirin 81 mg if recurs will consider rilonacept (Arcalyst). Hyperlipidemia: By virtue of the presence of aortic atherosclerosis, goal LDL is less than 70.  Start atorvastatin 20 mg, check lipid panel, LFTs, LP(a) in 2 months. Aortic atherosclerosis: Start aspirin 81 mg and atorvastatin 20 mg            Dispo:  Return in about 3 months (around 11/27/2024).       I spent 42 minutes reviewing all clinical data during and prior to this visit including all relevant imaging studies, laboratories, clinical information from other health systems and prior notes from both Cardiology and other specialties, interviewing the patient, conducting a complete physical examination, and coordinating care in order to formulate a comprehensive and personalized evaluation and treatment plan.   History of Present Illness:    FOCUSED PROBLEM LIST:   French-speaking only  Pericarditis URI August 2025 Small pericardial effusion PE CT Hyperlipidemia Aortic atherosclerosis Chest CT 2024 BMI 09 September 2024:  Patient consents to use of AI scribe. The patient is a 53 year old French-speaking female with above listed medical problems referred for recommendations regarding acute pericarditis.  The patient presented to the emergency department on June 22, 2024 with post URI chest pain.  A chest CT at that time demonstrated a  small pericardial effusion.  Her cardiac biomarkers were not elevated.  An EKG demonstrated no acute ischemic changes and mild ST elevations in the inferior leads.  She was treated with colchicine  and ibuprofen .  A CRP was not assessed.  She has been experiencing chest discomfort since August, which led to an emergency department visit. She was prescribed colchicine , which she continues to take twice daily, and ibuprofen , which she took three times daily for a couple of weeks. Despite this treatment, she continues to experience chest pain.  The chest pain radiates to her skull and is accompanied by fever, malaise, and shaking. The pain worsens with deep breaths but improves about an hour after taking ibuprofen . Leaning forward also provides some relief.  She mentions a gastric issue, describing it as 'gas' in her stomach, which causes her to vomit after eating. She is concerned about the potential impact of aspirin on this condition, although she is unsure of the specific gastric problem she has.  Additionally, she experiences numbness in her hands at night, feeling as though her hands are not part of her body. She also reports shaking with minimal physical activity.  She is from [place] and does not smoke.  No one at home speaks [place] or reads [place].     Current Medications: Current Meds  Medication Sig   aspirin EC 81 MG tablet Take 1 tablet (81 mg total) by mouth daily. Swallow whole.   atorvastatin (LIPITOR) 20 MG tablet Take 1 tablet (20 mg total) by mouth daily.   benzonatate  (TESSALON ) 100 MG capsule Take 1 capsule (100 mg total) by mouth every 8 (  eight) hours.   colchicine  0.6 MG tablet Take 1 tablet (0.6 mg total) by mouth 2 (two) times daily.   famotidine  (PEPCID ) 20 MG tablet Take 1 tablet (20 mg total) by mouth 2 (two) times daily.   guaiFENesin  (ROBITUSSIN) 100 MG/5ML liquid Take 5-10 mLs (100-200 mg total) by mouth every 4 (four) hours as needed for cough or to loosen phlegm.    [DISCONTINUED] ibuprofen  (ADVIL ) 800 MG tablet Take 1 tablet (800 mg total) by mouth every 8 (eight) hours as needed (pain).     Review of Systems:   Please see the history of present illness.    All other systems reviewed and are negative.     EKGs/Labs/Other Test Reviewed:   EKG: 2025 normal sinus rhythm  EKG Interpretation Date/Time:    Ventricular Rate:    PR Interval:    QRS Duration:    QT Interval:    QTC Calculation:   R Axis:      Text Interpretation:          CARDIAC STUDIES: Refer to CV Procedures and Imaging Tabs   Risk Assessment/Calculations:          Physical Exam:   VS:  BP 134/82   Pulse 71   Ht 5' (1.524 m)   Wt 175 lb 9.6 oz (79.7 kg)   LMP 11/25/2021   SpO2 99%   BMI 34.29 kg/m        Wt Readings from Last 3 Encounters:  08/27/24 175 lb 9.6 oz (79.7 kg)  07/13/24 184 lb 1.4 oz (83.5 kg)  06/26/24 184 lb 1.4 oz (83.5 kg)      GENERAL:  No apparent distress, AOx3 HEENT:  No carotid bruits, +2 carotid impulses, no scleral icterus CAR: RRR no murmurs, gallops, rubs, or thrills RES:  Clear to auscultation bilaterally ABD:  Soft, nontender, nondistended, positive bowel sounds x 4 VASC:  +2 radial pulses, +2 carotid pulses NEURO:  CN 2-12 grossly intact; motor and sensory grossly intact PSYCH:  No active depression or anxiety EXT:  No edema, ecchymosis, or cyanosis  Signed, Aloysuis Ribaudo K Adair Lemar, MD  08/27/2024 11:37 AM    [facility] [address] [place], [place], [place]  [address] Phone: [phone]; Fax: [phone]   Note:  This document was prepared using Dragon voice recognition software and may include unintentional dictation errors.

## 2024-08-27 ENCOUNTER — Other Ambulatory Visit (HOSPITAL_COMMUNITY): Payer: Self-pay

## 2024-08-27 ENCOUNTER — Ambulatory Visit: Attending: Internal Medicine | Admitting: Internal Medicine

## 2024-08-27 VITALS — BP 134/82 | HR 71 | Ht 60.0 in | Wt 175.6 lb

## 2024-08-27 DIAGNOSIS — I700 Atherosclerosis of aorta: Secondary | ICD-10-CM

## 2024-08-27 DIAGNOSIS — I300 Acute nonspecific idiopathic pericarditis: Secondary | ICD-10-CM | POA: Diagnosis not present

## 2024-08-27 DIAGNOSIS — E785 Hyperlipidemia, unspecified: Secondary | ICD-10-CM

## 2024-08-27 DIAGNOSIS — Z79899 Other long term (current) drug therapy: Secondary | ICD-10-CM | POA: Diagnosis not present

## 2024-08-27 MED ORDER — ASPIRIN 81 MG PO TBEC
81.0000 mg | DELAYED_RELEASE_TABLET | Freq: Every day | ORAL | 3 refills | Status: AC
  Filled 2024-08-27: qty 90, 90d supply, fill #0

## 2024-08-27 MED ORDER — ATORVASTATIN CALCIUM 20 MG PO TABS
20.0000 mg | ORAL_TABLET | Freq: Every day | ORAL | 3 refills | Status: AC
  Filled 2024-08-27: qty 30, 30d supply, fill #0

## 2024-08-27 NOTE — Patient Instructions (Signed)
 Medication Instructions:  Please STOP taking ibuprofen .   Please START taking aspirin 81 mg daily. This is a dose you can get over-the-counter. You do not need a prescription.  Please START atorvastatin 20 mg daily. Most people take this medication at bedtime.  Please CONTINUE your colchine for one month. Take your last dose on September 24, 2024.  *If you need a refill on your cardiac medications before your next appointment, please call your pharmacy*  Lab Work: Please complete a CRP today in our first floor lab before you leave.  Please complete a FASTING lipid panel, LFT, and lipoprotein a as soon as you can at any [facility].  If you have labs (blood work) drawn today and your tests are completely normal, you will receive your results only by: MyChart Message (if you have MyChart) OR A paper copy in the mail If you have any lab test that is abnormal or we need to change your treatment, we will call you to review the results.  Testing/Procedures: Your physician has requested that you have an echocardiogram. Echocardiography is a painless test that uses sound waves to create images of your heart. It provides your doctor with information about the size and shape of your heart and how well your heart's chambers and valves are working. This procedure takes approximately one hour. There are no restrictions for this procedure. Please do NOT wear cologne, perfume, aftershave, or lotions (deodorant is allowed). Please arrive 15 minutes prior to your appointment time.  Please note: We ask at that you not bring children with you during ultrasound (echo/ vascular) testing. Due to room size and safety concerns, children are not allowed in the ultrasound rooms during exams. Our front office staff cannot provide observation of children in our lobby area while testing is being conducted. An adult accompanying a patient to their appointment will only be allowed in the ultrasound room at the discretion of  the ultrasound technician under special circumstances. We apologize for any inconvenience.   Follow-Up: At [facility], you and your health needs are our priority.  As part of our continuing mission to provide you with exceptional heart care, our providers are all part of one team.  This team includes your primary Cardiologist (physician) and Advanced Practice Providers or APPs (Physician Assistants and Nurse Practitioners) who all work together to provide you with the care you need, when you need it.  Your next appointment:   3 month(s)  Provider:   Dr. Lurena Red, MD   We recommend signing up for the patient portal called MyChart.  Sign up information is provided on this After Visit Summary.  MyChart is used to connect with patients for Virtual Visits (Telemedicine).  Patients are able to view lab/test results, encounter notes, upcoming appointments, etc.  Non-urgent messages can be sent to your provider as well.   To learn more about what you can do with MyChart, go to [email or web address].

## 2024-08-31 ENCOUNTER — Ambulatory Visit: Payer: Self-pay | Admitting: Internal Medicine

## 2024-08-31 DIAGNOSIS — R7982 Elevated C-reactive protein (CRP): Secondary | ICD-10-CM

## 2024-08-31 LAB — C-REACTIVE PROTEIN: CRP: 22 mg/L — ABNORMAL HIGH (ref 0–10)

## 2024-09-01 ENCOUNTER — Other Ambulatory Visit (HOSPITAL_COMMUNITY): Payer: Self-pay

## 2024-09-01 ENCOUNTER — Telehealth: Payer: Self-pay | Admitting: Internal Medicine

## 2024-09-01 MED ORDER — COLCHICINE 0.6 MG PO TABS
0.6000 mg | ORAL_TABLET | Freq: Two times a day (BID) | ORAL | 0 refills | Status: AC
  Filled 2024-09-01: qty 60, 30d supply, fill #0

## 2024-09-01 NOTE — Telephone Encounter (Signed)
*  STAT* If patient is at the pharmacy, call can be transferred to refill team.   1. Which medications need to be refilled? (please list name of each medication and dose if known) colchicine  0.6 MG tablet   2. Which pharmacy/location (including street and city if local pharmacy) is medication to be sent to?  [facility]    3. Do they need a 30 day or 90 day supply? 90

## 2024-09-01 NOTE — Telephone Encounter (Signed)
 Per 10/17/205 office visit note by Dr Wendel:  Pericarditis: Will obtain CRP and echocardiogram. Continue colchicine  for 1 more month.

## 2024-09-01 NOTE — Telephone Encounter (Signed)
 Pt of Dr. Wendel. Does Dr. Wendel want to refill this RX? Please advise.

## 2024-09-09 ENCOUNTER — Ambulatory Visit: Payer: Self-pay | Admitting: Family Medicine

## 2024-09-29 ENCOUNTER — Ambulatory Visit (HOSPITAL_COMMUNITY)
Admission: RE | Admit: 2024-09-29 | Discharge: 2024-09-29 | Disposition: A | Discharge status: Home / Self Care | Source: Ambulatory Visit | Attending: Cardiology | Admitting: Cardiology

## 2024-09-29 DIAGNOSIS — I300 Acute nonspecific idiopathic pericarditis: Secondary | ICD-10-CM | POA: Insufficient documentation

## 2024-09-29 LAB — ECHOCARDIOGRAM COMPLETE
Area-P 1/2: 3.36 cm2
S' Lateral: 2.5 cm

## 2024-10-22 ENCOUNTER — Other Ambulatory Visit (HOSPITAL_COMMUNITY): Payer: Self-pay

## 2024-10-22 MED ORDER — SUCRALFATE 1 G PO TABS
1.0000 g | ORAL_TABLET | Freq: Four times a day (QID) | ORAL | 2 refills | Status: AC
  Filled 2024-10-22: qty 120, 30d supply, fill #0

## 2024-10-22 MED ORDER — PANTOPRAZOLE SODIUM 40 MG PO TBEC
40.0000 mg | DELAYED_RELEASE_TABLET | Freq: Every day | ORAL | 3 refills | Status: AC
  Filled 2024-10-22: qty 30, 30d supply, fill #0

## 2024-11-29 ENCOUNTER — Other Ambulatory Visit (HOSPITAL_COMMUNITY): Payer: Self-pay

## 2024-11-29 MED ORDER — MECLIZINE HCL 25 MG PO TABS
25.0000 mg | ORAL_TABLET | Freq: Four times a day (QID) | ORAL | 2 refills | Status: AC | PRN
  Filled 2024-11-29: qty 30, 8d supply, fill #0

## 2024-11-29 MED ORDER — IBUPROFEN 800 MG PO TABS
800.0000 mg | ORAL_TABLET | Freq: Three times a day (TID) | ORAL | 3 refills | Status: AC
  Filled 2024-11-29: qty 90, 30d supply, fill #0

## 2024-11-29 MED ORDER — AMOXICILLIN-POT CLAVULANATE 875-125 MG PO TABS
1.0000 | ORAL_TABLET | Freq: Two times a day (BID) | ORAL | 7 refills | Status: AC
  Filled 2024-11-29: qty 14, 7d supply, fill #0

## 2024-11-29 NOTE — Progress Notes (Unsigned)
 "  Cardiology Office Note:   Date:  11/29/2024  ID:  Alison Stanton, DOB December 21, 1970, MRN [id number] PCP:  Alec House, MD  [facility] Providers Cardiologist:  Wendel Haws, MD Referring MD: Alec House, MD  Chief Complaint/Reason for Referral: Pericarditis ASSESSMENT:    1. Idiopathic pericarditis, unspecified chronicity   2. Hyperlipidemia LDL goal <70   3. Aortic atherosclerosis      PLAN:   In order of problems listed above: Pericarditis: Check CRP.  Discontinue colchicine ***.  If CRP elevated will consider rilonacept.***   Hyperlipidemia: By virtue of the presence of aortic atherosclerosis, goal LDL is less than 70.  Continue atorvastatin  20 mg, check lipid panel, LFTs, LP(a) today.  *** Aortic atherosclerosis: Continue aspirin  81 mg and atorvastatin  20 mg            Dispo:  No follow-ups on file.       I spent *** minutes reviewing all clinical data during and prior to this visit including all relevant imaging studies, laboratories, clinical information from other health systems and prior notes from both Cardiology and other specialties, interviewing the Alison Stanton, conducting a complete physical examination, and coordinating care in order to formulate a comprehensive and personalized evaluation and treatment plan.   History of Present Illness:    FOCUSED PROBLEM LIST:   Alison only  Pericarditis URI August 2025 Small pericardial effusion PE CT Normal diastolic function, no significant valve issues, no effusion, EF 55 to 60% TTE November 2025 Hyperlipidemia Aortic atherosclerosis Chest CT 2024 BMI 09 September 2024:  Alison Stanton consents to use of AI scribe. The Alison Stanton is a 54 year old Alison Stanton with above listed medical problems referred for recommendations regarding acute pericarditis.  The Alison Stanton presented to the emergency department on June 22, 2024 with post URI chest pain.  A chest CT at that time demonstrated a small pericardial  effusion.  Her cardiac biomarkers were not elevated.  An EKG demonstrated no acute ischemic changes and mild ST elevations in the inferior leads.  She was treated with colchicine  and ibuprofen .  A CRP was not assessed.  She has been experiencing chest discomfort since August, which led to an emergency department visit. She was prescribed colchicine , which she continues to take twice daily, and ibuprofen , which she took three times daily for a couple of weeks. Despite this treatment, she continues to experience chest pain.  The chest pain radiates to her skull and is accompanied by fever, malaise, and shaking. The pain worsens with deep breaths but improves about an hour after taking ibuprofen . Leaning forward also provides some relief.  She mentions a gastric issue, describing it as 'gas' in her stomach, which causes her to vomit after eating. She is concerned about the potential impact of aspirin  on this condition, although she is unsure of the specific gastric problem she has.  Additionally, she experiences numbness in her hands at night, feeling as though her hands are not part of her body. She also reports shaking with minimal physical activity.  She is from [place] and does not smoke.  No one at home speaks English or reads English.  Plan: Stop NSAIDs, start aspirin  81 mg, continue colchicine  for 1 more month.  Obtain CRP and echocardiogram.  January 2026:  Alison Stanton consents to use of AI scribe. Alison Stanton CRP was elevated at 22.  Echocardiogram was reassuring with no pericardial effusion.     Current Medications: No outpatient medications have been marked as taking for the 12/09/24 encounter (Appointment) with  Stepfon Rawles K, MD.     Review of Systems:   Please see the history of present illness.    All other systems reviewed and are negative.     EKGs/Labs/Other Test Reviewed:   EKG: September 2025 sinus rhythm, low voltages  EKG Interpretation Date/Time:    Ventricular Rate:    PR  Interval:    QRS Duration:    QT Interval:    QTC Calculation:   R Axis:      Text Interpretation:          CARDIAC STUDIES: Refer to CV Procedures and Imaging Tabs   Risk Assessment/Calculations:          Physical Exam:   VS:  LMP 11/25/2021    No BP recorded.  {Refresh Note OR Click here to enter BP  :1}***   Wt Readings from Last 3 Encounters:  08/27/24 175 lb 9.6 oz (79.7 kg)  07/13/24 184 lb 1.4 oz (83.5 kg)  06/26/24 184 lb 1.4 oz (83.5 kg)      GENERAL:  No apparent distress, AOx3 HEENT:  No carotid bruits, +2 carotid impulses, no scleral icterus CAR: RRR no murmurs, gallops, rubs, or thrills RES:  Clear to auscultation bilaterally ABD:  Soft, nontender, nondistended, positive bowel sounds x 4 VASC:  +2 radial pulses, +2 carotid pulses NEURO:  CN 2-12 grossly intact; motor and sensory grossly intact PSYCH:  No active depression or anxiety EXT:  No edema, ecchymosis, or cyanosis  Signed, [name] K Taivon Haroon, MD  11/29/2024 11:41 AM    [facility] [address] [place], [place], [place]  [address] Phone: [phone]; Fax: [phone]   Note:  This document was prepared using Dragon voice recognition software and may include unintentional dictation errors. "

## 2024-12-09 ENCOUNTER — Ambulatory Visit: Attending: Internal Medicine | Admitting: Internal Medicine

## 2024-12-09 DIAGNOSIS — E785 Hyperlipidemia, unspecified: Secondary | ICD-10-CM

## 2024-12-09 DIAGNOSIS — I700 Atherosclerosis of aorta: Secondary | ICD-10-CM

## 2024-12-09 DIAGNOSIS — I300 Acute nonspecific idiopathic pericarditis: Secondary | ICD-10-CM
